# Patient Record
Sex: Male | Born: 1965 | Race: White | Hispanic: No | Marital: Married | Smoking: Former smoker
Health system: Southern US, Community
[De-identification: ages and names within clinical notes are randomized; demographics above are authoritative.]

## PROBLEM LIST (undated history)

## (undated) DIAGNOSIS — I10 Essential (primary) hypertension: Secondary | ICD-10-CM

## (undated) DIAGNOSIS — N200 Calculus of kidney: Secondary | ICD-10-CM

## (undated) DIAGNOSIS — R748 Abnormal levels of other serum enzymes: Secondary | ICD-10-CM

## (undated) DIAGNOSIS — M7022 Olecranon bursitis, left elbow: Secondary | ICD-10-CM

## (undated) DIAGNOSIS — M25431 Effusion, right wrist: Secondary | ICD-10-CM

## (undated) DIAGNOSIS — M059 Rheumatoid arthritis with rheumatoid factor, unspecified: Secondary | ICD-10-CM

## (undated) DIAGNOSIS — M722 Plantar fascial fibromatosis: Secondary | ICD-10-CM

## (undated) HISTORY — PX: LEG SURGERY: SHX1003

## (undated) HISTORY — DX: Abnormal levels of other serum enzymes: R74.8

## (undated) HISTORY — DX: Effusion, right wrist: M25.431

## (undated) HISTORY — PX: SHOULDER SURGERY: SHX246

## (undated) HISTORY — DX: Plantar fascial fibromatosis: M72.2

## (undated) HISTORY — DX: Olecranon bursitis, left elbow: M70.22

## (undated) HISTORY — DX: Rheumatoid arthritis with rheumatoid factor, unspecified: M05.9

---

## 2004-03-01 ENCOUNTER — Other Ambulatory Visit: Payer: Self-pay

## 2008-04-05 ENCOUNTER — Emergency Department: Payer: Self-pay | Admitting: Emergency Medicine

## 2009-03-22 ENCOUNTER — Emergency Department: Payer: Self-pay | Admitting: Unknown Physician Specialty

## 2017-03-15 DIAGNOSIS — Z79899 Other long term (current) drug therapy: Secondary | ICD-10-CM | POA: Insufficient documentation

## 2017-03-15 DIAGNOSIS — M7022 Olecranon bursitis, left elbow: Secondary | ICD-10-CM

## 2017-03-15 DIAGNOSIS — M722 Plantar fascial fibromatosis: Secondary | ICD-10-CM | POA: Insufficient documentation

## 2017-03-15 DIAGNOSIS — R748 Abnormal levels of other serum enzymes: Secondary | ICD-10-CM

## 2017-03-15 DIAGNOSIS — M059 Rheumatoid arthritis with rheumatoid factor, unspecified: Secondary | ICD-10-CM

## 2017-03-15 HISTORY — DX: Olecranon bursitis, left elbow: M70.22

## 2017-03-15 HISTORY — DX: Abnormal levels of other serum enzymes: R74.8

## 2017-03-15 HISTORY — DX: Plantar fascial fibromatosis: M72.2

## 2017-03-15 HISTORY — DX: Rheumatoid arthritis with rheumatoid factor, unspecified: M05.9

## 2018-05-24 DIAGNOSIS — M25431 Effusion, right wrist: Secondary | ICD-10-CM | POA: Insufficient documentation

## 2018-05-24 HISTORY — DX: Effusion, right wrist: M25.431

## 2018-10-19 ENCOUNTER — Emergency Department
Admission: EM | Admit: 2018-10-19 | Discharge: 2018-10-19 | Disposition: A | Discharge status: Home / Self Care | Payer: BLUE CROSS/BLUE SHIELD | Attending: Emergency Medicine | Admitting: Emergency Medicine

## 2018-10-19 ENCOUNTER — Emergency Department: Payer: BLUE CROSS/BLUE SHIELD

## 2018-10-19 ENCOUNTER — Other Ambulatory Visit: Payer: Self-pay

## 2018-10-19 DIAGNOSIS — F17200 Nicotine dependence, unspecified, uncomplicated: Secondary | ICD-10-CM | POA: Diagnosis not present

## 2018-10-19 DIAGNOSIS — N200 Calculus of kidney: Secondary | ICD-10-CM | POA: Diagnosis not present

## 2018-10-19 DIAGNOSIS — I10 Essential (primary) hypertension: Secondary | ICD-10-CM | POA: Insufficient documentation

## 2018-10-19 DIAGNOSIS — R1031 Right lower quadrant pain: Secondary | ICD-10-CM | POA: Diagnosis present

## 2018-10-19 HISTORY — DX: Calculus of kidney: N20.0

## 2018-10-19 HISTORY — DX: Essential (primary) hypertension: I10

## 2018-10-19 LAB — COMPREHENSIVE METABOLIC PANEL
ALK PHOS: 73 U/L (ref 38–126)
ALT: 42 U/L (ref 0–44)
AST: 30 U/L (ref 15–41)
Albumin: 4.1 g/dL (ref 3.5–5.0)
Anion gap: 9 (ref 5–15)
BUN: 20 mg/dL (ref 6–20)
CALCIUM: 9.1 mg/dL (ref 8.9–10.3)
CHLORIDE: 104 mmol/L (ref 98–111)
CO2: 24 mmol/L (ref 22–32)
CREATININE: 1.09 mg/dL (ref 0.61–1.24)
GFR calc Af Amer: >60 mL/min (ref >60)
GFR calc non Af Amer: >60 mL/min (ref >60)
Glucose, Bld: 145 mg/dL — ABNORMAL HIGH (ref 70–99)
Potassium: 4.1 mmol/L (ref 3.5–5.1)
SODIUM: 137 mmol/L (ref 135–145)
Total Bilirubin: 1 mg/dL (ref 0.3–1.2)
Total Protein: 7.5 g/dL (ref 6.5–8.1)

## 2018-10-19 LAB — URINALYSIS, COMPLETE (UACMP) WITH MICROSCOPIC
Bacteria, UA: NONE SEEN
Bilirubin Urine: NEGATIVE
GLUCOSE, UA: NEGATIVE mg/dL
Hgb urine dipstick: NEGATIVE
Ketones, ur: NEGATIVE mg/dL
Leukocytes, UA: NEGATIVE
NITRITE: NEGATIVE
PROTEIN: NEGATIVE mg/dL
SPECIFIC GRAVITY, URINE: 1.019 (ref 1.005–1.030)
SQUAMOUS EPITHELIAL / LPF: NONE SEEN (ref 0–5)
pH: 7 (ref 5.0–8.0)

## 2018-10-19 LAB — CBC
HEMATOCRIT: 42.3 % (ref 39.0–52.0)
Hemoglobin: 14.3 g/dL (ref 13.0–17.0)
MCH: 30.8 pg (ref 26.0–34.0)
MCHC: 33.8 g/dL (ref 30.0–36.0)
MCV: 91.2 fL (ref 80.0–100.0)
Platelets: 184 10*3/uL (ref 150–400)
RBC: 4.64 MIL/uL (ref 4.22–5.81)
RDW: 12.7 % (ref 11.5–15.5)
WBC: 9.1 10*3/uL (ref 4.0–10.5)
nRBC: 0 % (ref 0.0–0.2)

## 2018-10-19 MED ORDER — OXYCODONE-ACETAMINOPHEN 5-325 MG PO TABS: 1.0000 | ORAL_TABLET | Freq: Three times a day (TID) | ORAL | 0 refills | Status: DC | PRN

## 2018-10-19 MED ORDER — HYDROMORPHONE HCL 1 MG/ML IJ SOLN
0.5000 mg | Freq: Once | INTRAMUSCULAR | Status: AC
  Administered 2018-10-19: 0.5 mg via INTRAVENOUS
  Filled 2018-10-19: qty 1

## 2018-10-19 MED ORDER — ONDANSETRON 4 MG PO TBDP: 4.0000 mg | ORAL_TABLET | Freq: Three times a day (TID) | ORAL | 0 refills | Status: DC | PRN

## 2018-10-19 MED ORDER — KETOROLAC TROMETHAMINE 30 MG/ML IJ SOLN
30.0000 mg | Freq: Once | INTRAMUSCULAR | Status: AC
  Administered 2018-10-19: 30 mg via INTRAVENOUS
  Filled 2018-10-19: qty 1

## 2018-10-19 MED ORDER — ONDANSETRON HCL 4 MG/2ML IJ SOLN
4.0000 mg | Freq: Once | INTRAMUSCULAR | Status: AC
  Administered 2018-10-19: 4 mg via INTRAVENOUS
  Filled 2018-10-19: qty 2

## 2018-10-19 MED ORDER — TAMSULOSIN HCL 0.4 MG PO CAPS: 0.4000 mg | ORAL_CAPSULE | Freq: Every day | ORAL | 0 refills | Status: DC

## 2018-10-19 NOTE — ED Notes (Signed)
Patient transported to CT 

## 2018-10-19 NOTE — ED Notes (Signed)
First Nurse Note: Patient ambulatory to Rm 25, Mateo Flow RN aware of room placement.

## 2018-10-19 NOTE — ED Provider Notes (Signed)
[facility] Emergency Department Provider Note       Time seen: ----------------------------------------- 8:02 AM on 10/19/2018 -----------------------------------------   I have reviewed the triage vital signs and the nursing notes.  HISTORY   Chief Complaint Flank Pain    HPI Derrick Merritt is a 52 y.o. male with a history of hypertension and kidney stones who presents to the ED for right flank pain.  Patient arrives by private vehicle from home complaining of right flank pain and hematuria since Sunday.  He has not had fever, chest pain or shortness of breath.  He has had nausea but no vomiting, reports kidney stones in the past that felt similarly.  Past Medical History:  Diagnosis Date  . Hypertension   . Kidney stones     There are no active problems to display for this patient.   Past Surgical History:  Procedure Laterality Date  . LEG SURGERY    . SHOULDER SURGERY      Allergies Patient has no known allergies.  Social History Social History   Tobacco Use  . Smoking status: Current Every Day Smoker  . Smokeless tobacco: Never Used  Substance Use Topics  . Alcohol use: Not on file  . Drug use: Not on file   Review of Systems Constitutional: Negative for fever. Cardiovascular: Negative for chest pain. Respiratory: Negative for shortness of breath. Gastrointestinal: Positive for flank pain Genitourinary: Negative for dysuria. Musculoskeletal: Negative for back pain. Skin: Negative for rash. Neurological: Negative for headaches, focal weakness or numbness.  All systems negative/normal/unremarkable except as stated in the HPI  ____________________________________________   PHYSICAL EXAM:  VITAL SIGNS: ED Triage Vitals  Enc Vitals Group     BP 10/19/18 0632 (!) 183/84     Pulse Rate 10/19/18 0632 71     Resp 10/19/18 0632 18     Temp 10/19/18 0632 97.7 F (36.5 C)     Temp Source 10/19/18 0632 Oral     SpO2 10/19/18  0632 98 %     Weight 10/19/18 0629 278 lb (126.1 kg)     Height 10/19/18 0629 6\' 1"  (1.854 m)     Head Circumference --      Peak Flow --      Pain Score 10/19/18 0629 6     Pain Loc --      Pain Edu? --      Excl. in [place]? --    Constitutional: Alert and oriented. Well appearing and in no distress. Eyes: Conjunctivae are normal. Normal extraocular movements. Cardiovascular: Normal rate, regular rhythm. No murmurs, rubs, or gallops. Respiratory: Normal respiratory effort without tachypnea nor retractions. Breath sounds are clear and equal bilaterally. No wheezes/rales/rhonchi. Gastrointestinal: Right flank tenderness, no rebound or guarding.  Normal bowel sounds. Musculoskeletal: Nontender with normal range of motion in extremities. No lower extremity tenderness nor edema. Neurologic:  Normal speech and language. No gross focal neurologic deficits are appreciated.  Skin:  Skin is warm, dry and intact. No rash noted. Psychiatric: Mood and affect are normal. Speech and behavior are normal.  ____________________________________________  ED COURSE:  As part of my medical decision making, I reviewed the following data within the [facility] History obtained from family if available, nursing notes, old chart and ekg, as well as notes from prior ED visits. Patient presented for right flank pain, we will assess with labs and imaging as indicated at this time.   Procedures ____________________________________________   LABS (pertinent positives/negatives)  Labs  Reviewed  URINALYSIS, COMPLETE (UACMP) WITH MICROSCOPIC - Abnormal; Notable for the following components:      Result Value   Color, Urine YELLOW (*)    APPearance CLEAR (*)    All other components within normal limits  COMPREHENSIVE METABOLIC PANEL - Abnormal; Notable for the following components:   Glucose, Bld 145 (*)    All other components within normal limits  CBC    RADIOLOGY Images were viewed by  me  Abdomen 2 view IMPRESSION: Negative. CT renal protocol IMPRESSION: 1. Obstructing calculus at the RIGHT vesicoureteral junction with moderate hydronephrosis and hydroureter on the RIGHT. 2. No nephrolithiasis.  ____________________________________________  DIFFERENTIAL DIAGNOSIS   Renal colic, UTI, pyelonephritis, gas pain, muscle strain, radicular back pain  FINAL ASSESSMENT AND PLAN  Renal colic   Plan: The patient had presented for right-sided flank pain. Patient's labs were unremarkable. Patient's imaging initially was negative, and CT there did appear to be a right UVJ stone.  He will be discharged with Flomax, pain medicine and antiemetics and referred to urology for outpatient follow-up.   Laurence Aly, MD   Note: This note was generated in part or whole with voice recognition software. Voice recognition is usually quite accurate but there are transcription errors that can and very often do occur. I apologize for any typographical errors that were not detected and corrected.     Earleen Newport, MD 10/19/18 [phone]

## 2018-10-19 NOTE — ED Triage Notes (Signed)
Pt arrives to ED via POV from home with c/o right flank pain and hematuria since Sunday. No fever, CP or SHOB. Pt reports (+) nausea, but denies vomiting. Pt reports h/x of kidney stones in past and today's s/x's are similar. Ptis A&O, in NAD; RR even, regular, and unlabored.

## 2018-10-19 NOTE — ED Notes (Addendum)
Patient transported to x-ray. ?

## 2018-11-06 ENCOUNTER — Encounter: Payer: Self-pay | Admitting: Urology

## 2018-11-06 ENCOUNTER — Ambulatory Visit (INDEPENDENT_AMBULATORY_CARE_PROVIDER_SITE_OTHER): Payer: BLUE CROSS/BLUE SHIELD | Admitting: Urology

## 2018-11-06 VITALS — BP 149/86 | HR 82 | Ht 72.0 in | Wt 275.0 lb

## 2018-11-06 DIAGNOSIS — N200 Calculus of kidney: Secondary | ICD-10-CM

## 2018-11-06 NOTE — Progress Notes (Signed)
   11/06/2018 3:16 PM   Derrick Merritt 06-01-1966 [id number]  CC: 5 mm right UPJ stone  HPI: I saw Derrick Merritt in urology clinic today for discussion of nephrolithiasis.  He is a healthy 53 year old male with a history of 4-5 spontaneously passed kidney stones who was seen in the emergency department on 10/19/2018 with right-sided back pain and gross hematuria.  CT scan showed a 5 mm right UPJ stone with severe upstream hydronephrosis.  He was discharged with medical expulsive therapy.  He states that a week later he had had a complete resolution of his pain, although he did not visualize a stone passing.  He denies any complaints today including hematuria, urgency, frequency, dysuria, or flank pain.  No aggravating or alleviating factors.  Severity is mild.   PMH: Past Medical History:  Diagnosis Date  . Elevated liver enzymes 03/15/2017  . Hypertension   . Kidney stones   . Olecranon bursitis of left elbow 03/15/2017  . 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 Swelling of joint, wrist, right 05/24/2018   SYNOVIAL THICKENING RIGHT DORSUM WRIST    Surgical History: Past Surgical History:  Procedure Laterality Date  . LEG SURGERY    . SHOULDER SURGERY      Allergies:  Allergies  Allergen Reactions  . Pollen Extract Rash    Family History: No family history on file.  Social History:  reports that he has been smoking. He has never used smokeless tobacco. No history on file for alcohol and drug.  ROS: Please see flowsheet from today's date for complete review of systems.  Physical Exam: BP (!) 149/86   Pulse 82   Ht 6' (1.829 m)   Wt 275 lb (124.7 kg)   BMI 37.30 kg/m    Constitutional:  Alert and oriented, No acute distress. Cardiovascular: No clubbing, cyanosis, or edema. Respiratory: Normal respiratory effort, no increased work of breathing. GI: Abdomen  is soft, nontender, nondistended, no abdominal masses GU: No CVA tenderness Lymph: No cervical or inguinal lymphadenopathy. Skin: No rashes, bruises or suspicious lesions. Neurologic: Grossly intact, no focal deficits, moving all 4 extremities. Psychiatric: Normal mood and affect.  Laboratory Data: Reviewed  Pertinent Imaging: I have personally reviewed the CT stone protocol dated 10/19/2018.  There is a right 5 mm UPJ stone with severe right hydronephrosis.  Assessment & Plan:   In summary, Derrick Merritt is a healthy 53 year old male with multiple spontaneously passed kidney stones, including likely recent spontaneous passage of a 5 mm right UPJ stone.  His symptoms have completely resolved, though he has not seen or caught a stone with intermittent straining of his urine.  We discussed general stone prevention strategies including adequate hydration with goal of producing 2.5 L of urine daily, increasing citric acid intake, increasing calcium intake during high oxalate meals, minimizing animal protein, and decreasing salt intake. Information about dietary recommendations given today.   Renal ultrasound to rule out persistent ureteral stone/hydronephrosis, will call with results Follow-up as needed  Billey Co, [place] [address] [place], [place] [address] [phone]

## 2018-11-15 ENCOUNTER — Ambulatory Visit
Admission: RE | Admit: 2018-11-15 | Discharge: 2018-11-15 | Disposition: A | Discharge status: Home / Self Care | Payer: BLUE CROSS/BLUE SHIELD | Source: Ambulatory Visit | Attending: Urology | Admitting: Urology

## 2018-11-15 DIAGNOSIS — N200 Calculus of kidney: Secondary | ICD-10-CM | POA: Insufficient documentation

## 2018-11-16 ENCOUNTER — Telehealth: Payer: Self-pay

## 2018-11-16 NOTE — Telephone Encounter (Signed)
-----   Message from Billey Co, MD sent at 11/16/2018  8:15 AM EST ----- His renal ultrasound does not show any swelling in his kidneys, and looks like the stone has passed. Can follow up as needed  Nickolas Madrid, MD 11/16/2018

## 2018-11-16 NOTE — Telephone Encounter (Signed)
Informed patient of results and recommendations. 

## 2019-02-27 ENCOUNTER — Ambulatory Visit (INDEPENDENT_AMBULATORY_CARE_PROVIDER_SITE_OTHER): Payer: BLUE CROSS/BLUE SHIELD | Admitting: Gastroenterology

## 2019-02-27 ENCOUNTER — Other Ambulatory Visit: Payer: Self-pay

## 2019-02-27 VITALS — Ht 72.0 in | Wt 270.0 lb

## 2019-02-27 DIAGNOSIS — K921 Melena: Secondary | ICD-10-CM | POA: Diagnosis not present

## 2019-02-27 DIAGNOSIS — R1011 Right upper quadrant pain: Secondary | ICD-10-CM

## 2019-02-27 MED ORDER — NA SULFATE-K SULFATE-MG SULF 17.5-3.13-1.6 GM/177ML PO SOLN: 1.0000 | Freq: Once | ORAL | 0 refills | Status: AC

## 2019-02-27 NOTE — Patient Instructions (Addendum)
I am recommending a trial of pantoprazole 40 daily x 8 weeks.  Avoid all anti-inflammatory medications (such as NSAIDs)  I am recommending an EGD and Colonoscopy when the covid19 restrictions will allow. We will can to schedule the procedures when able.  Please call in the meantime with any additional questions or concerns in the meantime.   Thank you for your patience with me and our technology today! Please stay home, safe, and healthy. I look forward to meeting you in person in the future.

## 2019-02-27 NOTE — Progress Notes (Signed)
TELEHEALTH VISIT  Referring Provider: Leonard Downing, * Primary Care Physician:  Leonard Downing, MD   Tele-visit due to COVID-19 pandemic Patient requested visit virtually, consented to the virtual encounter via video enabled telemedicine application (Doximity difficulty with the download, converted to telephone encounter) Contact made at: 15:34 02/27/19 Patient verified by name and date of birth Location of patient: Home Location provider: Office Names of persons participating: Me, patient, Magdalene River CMA Time spent on telehealth visit: 26 minutes I discussed the limitations of evaluation and management by telemedicine. The patient expressed understanding and agreed to proceed.  Reason for Consultation:  Positive hemosure   IMPRESSION:  Positive hemossure RUQ pain Daily prednisone for joint pain No prior colon cancer screening No known family history of colon cancer or polyps  Colonoscopy recommended given the positive hemosure and no prior colon cancer screening.   Differential for RUQ is broad. I have recommended empiric treatment for possible esophagitis, gastritis, or prednisone-related peptic ulcer disease. Will plan EGD at the time of colonoscopy for further evaluation.   PLAN: Trial of pantoprazole 40 daily x 8 weeks Avoid all NSAIDs EGD and Colonoscopy   I consented the patient discussing the risks, benefits, and alternatives to endoscopic evaluation. In particular, we discussed the risks that include, but are not limited to, reaction to medication, cardiopulmonary compromise, bleeding requiring blood transfusion, aspiration resulting in pneumonia, perforation requiring surgery, lack of diagnosis, severe illness requiring hospitalization, and even death. We reviewed the risk of missed lesion including polyps or even cancer. The patient acknowledges these risks and asks that we proceed.   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 Arelia Sneddon for further evaluation of hemosure. The history is obtained through the patient and review of his electronic health record.   Hemoccult positive during routine exam with primary care provider. Intermittent dark spots in the stool. No frank red blood.  No known associated anemia.   For the last year he has been concerned that he may have a hernia. Intermittent abdominal pain with ache if he overdoes located at the bottom of his right rib cage. Over the last 2 weeks, he feels a pressure in the area where he thought a hernia would be. No nausea or vomiting. No odynophagia or dysphagia. No other abdominal pain. Pain is not severe enough to justify medication.   Constipation associated with Mucinex use in December. Resolved with a stool softener. No other change in bowel habits. He has one to two formed to hard bowel movements daily. No other associated symptoms. No identified exacerbating or relieving features.   On prednisone for arthritis. Denies the use of any NSAIDs including Naproxen.   No prior endoscopic evaluation.   No known family history of colon cancer or polyps. No family history of uterine/endometrial cancer, pancreatic cancer or gastric/stomach cancer.  Review of EPIC shows a CT renal protocol 10/19/18 that showed no abdominal abnormalities. He did have an obstructing calculus at the right vesicoureteral junction with moderate hydropnephrosis and hydroureter on the right.   Labs from 10/19/18 show a normal CMP except for glucose of 145 and normal CBC with hgb 14.3, MCV 91.2, RDW 12.7.   Past Medical History:  Diagnosis Date  . Elevated liver enzymes 03/15/2017  . Hypertension   . Kidney stones   . Olecranon bursitis of left elbow 03/15/2017  . 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 Swelling of joint, wrist, right 05/24/2018   SYNOVIAL THICKENING RIGHT  DORSUM WRIST    Past Surgical History:  Procedure Laterality Date  . LEG SURGERY    . SHOULDER SURGERY      Current Outpatient Medications  Medication Sig Dispense Refill  . Abatacept (ORENCIA CLICKJECT) 941 MG/ML SOAJ Inject 125 mg into the skin once a week.    . calcium carbonate (OSCAL) 1500 (600 Ca) MG TABS tablet Take 1,500 mg by mouth daily with breakfast.    . naproxen sodium (ALEVE) 220 MG tablet Take 1 tablet by mouth every 6 (six) hours as needed.    . predniSONE (DELTASONE) 1 MG tablet Take 4 tablets by mouth daily. Take daily with breakfast    . terazosin (HYTRIN) 10 MG capsule Take 10 mg by mouth at bedtime. Take two capsules by mouth daily at bedtime     No current facility-administered medications for this visit.     Allergies as of 02/27/2019 - Review Complete 02/27/2019  Allergen Reaction Noted  . Pollen extract Rash 03/15/2017    Family History  Problem Relation Age of Onset  . Diabetes Mother   . Hypertension Mother   . Heart disease Mother   . Diabetes Father   . Hypertension Father   . Colon cancer Neg Hx   . Stomach cancer Neg Hx   . Pancreatic cancer Neg Hx   . Esophageal cancer Neg Hx     Social History   Socioeconomic History  . Marital status: Married    Spouse name: Not on file  . Number of children: Not on file  . Years of education: Not on file  . Highest education level: Not on file  Occupational History  . Not on file  Social Needs  . Financial resource strain: Not on file  . Food insecurity:    Worry: Not on file    Inability: Not on file  . Transportation needs:    Medical: Not on file    Non-medical: Not on file  Tobacco Use  . Smoking status: Current Every Day Smoker  . Smokeless tobacco: Never Used  . Tobacco comment: 1 ppd   Substance and Sexual Activity  . Alcohol use: Yes    Comment: occ  . Drug use: Yes  . Sexual activity: Yes    Partners: Female  Lifestyle  . Physical activity:    Days per week: Not on file     Minutes per session: Not on file  . Stress: Not on file  Relationships  . Social connections:    Talks on phone: Not on file    Gets together: Not on file    Attends religious service: Not on file    Active member of club or organization: Not on file    Attends meetings of clubs or organizations: Not on file    Relationship status: Not on file  . Intimate partner violence:    Fear of current or ex partner: Not on file    Emotionally abused: Not on file    Physically abused: Not on file    Forced sexual activity: Not on file  Other Topics Concern  . Not on file  Social History Narrative  . Not on file    Review of Systems: ALL ROS discussed and all others negative except listed in HPI.  Physical Exam: General: in no acute distress Neuro: Alert and appropriate Psych: Normal affect and  normal insight   Melyna Huron L. Tarri Glenn, MD, MPH [facility] 02/27/2019, 3:33 PM

## 2019-03-21 ENCOUNTER — Telehealth: Payer: Self-pay | Admitting: *Deleted

## 2019-03-21 NOTE — Telephone Encounter (Signed)
Noted. Thank you. May proceed with endoscopy.

## 2019-03-21 NOTE — Telephone Encounter (Signed)
Covid-19 travel screening questions  Have you traveled in the last 14 days?yes If yes where? [place]  Do you now or have you had a fever in the last 14 days? No  Do you have any respiratory symptoms of shortness of breath or cough now or in the last 14 days?no  Do you have a medical history of Congestive Heart Failure?  Do you have a medical history of lung disease?  Do you have any family members or close contacts with diagnosed or suspected Covid-19?no  Pt has been made aware of the care partner policy and will bring mask with him if he has one available. SM

## 2019-03-22 ENCOUNTER — Encounter: Payer: Self-pay | Admitting: Gastroenterology

## 2019-03-22 ENCOUNTER — Ambulatory Visit (AMBULATORY_SURGERY_CENTER): Payer: BLUE CROSS/BLUE SHIELD | Admitting: Gastroenterology

## 2019-03-22 ENCOUNTER — Other Ambulatory Visit: Payer: Self-pay

## 2019-03-22 VITALS — BP 137/87 | HR 54 | Temp 98.4°F | Resp 14 | Ht 72.0 in | Wt 270.0 lb

## 2019-03-22 DIAGNOSIS — K2970 Gastritis, unspecified, without bleeding: Secondary | ICD-10-CM

## 2019-03-22 DIAGNOSIS — D124 Benign neoplasm of descending colon: Secondary | ICD-10-CM

## 2019-03-22 DIAGNOSIS — K921 Melena: Secondary | ICD-10-CM

## 2019-03-22 DIAGNOSIS — D122 Benign neoplasm of ascending colon: Secondary | ICD-10-CM

## 2019-03-22 DIAGNOSIS — R1011 Right upper quadrant pain: Secondary | ICD-10-CM

## 2019-03-22 MED ORDER — SODIUM CHLORIDE 0.9 % IV SOLN: 500.0000 mL | Freq: Once | INTRAVENOUS | Status: DC

## 2019-03-22 NOTE — Op Note (Signed)
[facility] Patient Name: Derrick Merritt Procedure Date: 03/22/2019 2:52 PM MRN: [id number] Endoscopist: Thornton Park MD, MD Age: 53 Referring MD:  Date of Birth: Apr 01, 1966 Gender: Male Account #: [id number] Procedure:                Colonoscopy Indications:              Heme positive stool                           RUQ pain                           Daily prednisone for joint pain                           No prior colon cancer screening                           No known family history of colon cancer or                            polypsPositive hemossure                           RUQ pain Medicines:                See the Anesthesia note for documentation of the                            administered medications Procedure:                Pre-Anesthesia Assessment:                           - Prior to the procedure, a History and Physical                            was performed, and patient medications and                            allergies were reviewed. The patient's tolerance of                            previous anesthesia was also reviewed. The risks                            and benefits of the procedure and the sedation                            options and risks were discussed with the patient.                            All questions were answered, and informed consent                            was obtained. Prior Anticoagulants: The patient has  taken no previous anticoagulant or antiplatelet                            agents. ASA Grade Assessment: II - A patient with                            mild systemic disease. After reviewing the risks                            and benefits, the patient was deemed in                            satisfactory condition to undergo the procedure.                           - Prior to the procedure, a History and Physical                            was performed, and patient medications and                             allergies were reviewed. The patient's tolerance of                            previous anesthesia was also reviewed. The risks                            and benefits of the procedure and the sedation                            options and risks were discussed with the patient.                            All questions were answered, and informed consent                            was obtained. Prior Anticoagulants: The patient has                            taken no previous anticoagulant or antiplatelet                            agents. ASA Grade Assessment: II - A patient with                            mild systemic disease. After reviewing the risks                            and benefits, the patient was deemed in                            satisfactory condition to undergo the procedure.  After obtaining informed consent, the colonoscope                            was passed under direct vision. Throughout the                            procedure, the patient's blood pressure, pulse, and                            oxygen saturations were monitored continuously. The                            Colonoscope was introduced through the anus and                            advanced to the the terminal ileum, with                            identification of the appendiceal orifice and IC                            valve. The colonoscopy was performed without                            difficulty. The patient tolerated the procedure                            well. The quality of the bowel preparation was                            good. The terminal ileum, ileocecal valve,                            appendiceal orifice, and rectum were photographed. Scope In: 3:10:01 PM Scope Out: 3:25:56 PM Scope Withdrawal Time: 0 hours 12 minutes 38 seconds  Total Procedure Duration: 0 hours 15 minutes 55 seconds  Findings:                 A 4 mm  polyp was found in the ascending colon. The                            polyp was sessile. The polyp was removed with a                            cold snare. Resection and retrieval were complete.                            Estimated blood loss was minimal.                           Three sessile polyps were found in the descending                            colon. The polyps were  3 mm in size. These polyps                            were removed with a cold snare. Resection and                            retrieval were complete. Estimated blood loss was                            minimal.                           The exam was otherwise without abnormality on                            direct and retroflexion views.                           External hemorrhoids were found on perianal exam. Complications:            No immediate complications. Estimated blood loss:                            Minimal. Impression:               - One 4 mm polyp in the ascending colon, removed                            with a cold snare. Resected and retrieved.                           - Three 3 mm polyps in the descending colon,                            removed with a cold snare. Resected and retrieved.                           - External hemorrhoids.                           - The examination was otherwise normal on direct                            and retroflexion views. Recommendation:           - Patient has a contact number available for                            emergencies. The signs and symptoms of potential                            delayed complications were discussed with the                            patient. Return to normal activities tomorrow.  Written discharge instructions were provided to the                            patient.                           - Resume regular diet.                           - Continue present medications.                            - Await pathology results.                           - Repeat colonoscopy date to be determined after                            pending pathology results are reviewed for                            surveillance based on pathology results. Thornton Park MD, MD 03/22/2019 3:39:49 PM This report has been signed electronically.

## 2019-03-22 NOTE — Progress Notes (Signed)
Pt's states no medical or surgical changes since previsit or office visit. 

## 2019-03-22 NOTE — Progress Notes (Signed)
To PACU, VSS. Report to Rn.tb 

## 2019-03-22 NOTE — Patient Instructions (Signed)
Handouts given for polyps and hemorrhoids.  Await biopsy results.  YOU HAD AN ENDOSCOPIC PROCEDURE TODAY AT [facility]:   Refer to the procedure report that was given to you for any specific questions about what was found during the examination.  If the procedure report does not answer your questions, please call your gastroenterologist to clarify.  If you requested that your care partner not be given the details of your procedure findings, then the procedure report has been included in a sealed envelope for you to review at your convenience later.  YOU SHOULD EXPECT: Some feelings of bloating in the abdomen. Passage of more gas than usual.  Walking can help get rid of the air that was put into your GI tract during the procedure and reduce the bloating. If you had a lower endoscopy (such as a colonoscopy or flexible sigmoidoscopy) you may notice spotting of blood in your stool or on the toilet paper. If you underwent a bowel prep for your procedure, you may not have a normal bowel movement for a few days.  Please Note:  You might notice some irritation and congestion in your nose or some drainage.  This is from the oxygen used during your procedure.  There is no need for concern and it should clear up in a day or so.  SYMPTOMS TO REPORT IMMEDIATELY:   Following lower endoscopy (colonoscopy or flexible sigmoidoscopy):  Excessive amounts of blood in the stool  Significant tenderness or worsening of abdominal pains  Swelling of the abdomen that is new, acute  Fever of 100F or higher   Following upper endoscopy (EGD)  Vomiting of blood or coffee ground material  New chest pain or pain under the shoulder blades  Painful or persistently difficult swallowing  New shortness of breath  Fever of 100F or higher  Black, tarry-looking stools  For urgent or emergent issues, a gastroenterologist can be reached at any hour by calling [phone].   DIET:  We do recommend a small  meal at first, but then you may proceed to your regular diet.  Drink plenty of fluids but you should avoid alcoholic beverages for 24 hours.  ACTIVITY:  You should plan to take it easy for the rest of today and you should NOT DRIVE or use heavy machinery until tomorrow (because of the sedation medicines used during the test).    FOLLOW UP: Our staff will call the number listed on your records 48-72 hours following your procedure to check on you and address any questions or concerns that you may have regarding the information given to you following your procedure. If we do not reach you, we will leave a message.  We will attempt to reach you two times.  During this call, we will ask if you have developed any symptoms of COVID 19. If you develop any symptoms (ie: fever, flu-like symptoms, shortness of breath, cough etc.) before then, please call [phone].  If you test positive for Covid 19 in the 2 weeks post procedure, please call and report this information to [place].    If any biopsies were taken you will be contacted by phone or by letter within the next 1-3 weeks.  Please call us at [phone] if you have not heard about the biopsies in 3 weeks.    SIGNATURES/CONFIDENTIALITY: You and/or your care partner have signed paperwork which will be entered into your electronic medical record.  These signatures attest to the fact that that the information  above on your After Visit Summary has been reviewed and is understood.  Full responsibility of the confidentiality of this discharge information lies with you and/or your care-partner. 

## 2019-03-22 NOTE — Op Note (Signed)
[facility] Patient Name: Derrick Merritt Procedure Date: 03/22/2019 2:51 PM MRN: [id number] Endoscopist: Thornton Park MD, MD Age: 53 Referring MD:  Date of Birth: 02-Nov-1965 Gender: Male Account #: [id number] Procedure:                Upper GI endoscopy Indications:              Abdominal pain in the right upper quadrant Medicines:                See the Anesthesia note for documentation of the                            administered medications Procedure:                Pre-Anesthesia Assessment:                           - Prior to the procedure, a History and Physical                            was performed, and patient medications and                            allergies were reviewed. The patient's tolerance of                            previous anesthesia was also reviewed. The risks                            and benefits of the procedure and the sedation                            options and risks were discussed with the patient.                            All questions were answered, and informed consent                            was obtained. Prior Anticoagulants: The patient has                            taken no previous anticoagulant or antiplatelet                            agents. ASA Grade Assessment: II - A patient with                            mild systemic disease. After reviewing the risks                            and benefits, the patient was deemed in                            satisfactory condition to undergo the procedure.  After obtaining informed consent, the endoscope was                            passed under direct vision. Throughout the                            procedure, the patient's blood pressure, pulse, and                            oxygen saturations were monitored continuously. The                            Endoscope was introduced through the mouth, and                            advanced to the  second part of duodenum. The upper                            GI endoscopy was accomplished without difficulty.                            The patient tolerated the procedure well. Scope In: Scope Out: Findings:                 The examined esophagus was normal.                           The entire examined stomach was normal. Biopsies                            were taken with a cold forceps for Helicobacter                            pylori testing. Estimated blood loss was minimal.                           The examined duodenum was normal.                           The exam was otherwise without abnormality. Complications:            No immediate complications. Estimated blood loss:                            Minimal. Impression:               - Normal esophagus.                           - Normal stomach. Biopsied.                           - Normal examined duodenum.                           - The examination was otherwise normal. Recommendation:           -  Patient has a contact number available for                            emergencies. The signs and symptoms of potential                            delayed complications were discussed with the                            patient. Return to normal activities tomorrow.                            Written discharge instructions were provided to the                            patient.                           - Resume regular diet.                           - Continue present medications.                           - Await pathology results.                           - Repeat upper endoscopy is not recommended at this                            time.                           - Proceed with colonoscopy as previously planned. Thornton Park MD, MD 03/22/2019 3:34:22 PM This report has been signed electronically.

## 2019-03-22 NOTE — Progress Notes (Signed)
Called to room to assist during endoscopic procedure.  Patient ID and intended procedure confirmed with present staff. Received instructions for my participation in the procedure from the performing physician.  

## 2019-03-26 ENCOUNTER — Telehealth: Payer: Self-pay | Admitting: *Deleted

## 2019-03-26 ENCOUNTER — Encounter: Payer: Self-pay | Admitting: Gastroenterology

## 2019-03-26 NOTE — Telephone Encounter (Signed)
  Follow up Call-  Call back number 03/22/2019  Post procedure Call Back phone  # [phone]  Permission to leave phone message Yes  Some recent data might be hidden     Patient questions:  Do you have a fever, pain , or abdominal swelling? No. Pain Score  0 *  Have you tolerated food without any problems? Yes.    Have you been able to return to your normal activities? Yes.    Do you have any questions about your discharge instructions: Diet   No. Medications  No. Follow up visit  No.  Do you have questions or concerns about your Care? No.  Actions: * If pain score is 4 or above: No action needed, pain <4.  1. Have you developed a fever since your procedure? NO  2.   Have you had an respiratory symptoms (SOB or cough) since your procedure? NO  3.   Have you tested positive for COVID 19 since your procedure NO  4.   Have you had any family members/close contacts diagnosed with the COVID 19 since your procedure?  NO   If yes to any of these questions please route to Joylene Achilles, RN and Alphonsa Gin, RN.

## 2019-07-04 IMAGING — US US RENAL
1 series · 14 of 25 positions shown · non-contrast
Comparison: None.

CLINICAL DATA: Nephrolithiasis.  Prior right hydronephrosis.

EXAM:
RENAL / URINARY TRACT ULTRASOUND COMPLETE

[Series 1: us renal · 0.31mm/px · 14 of 33 slices shown]
[im 1/33]
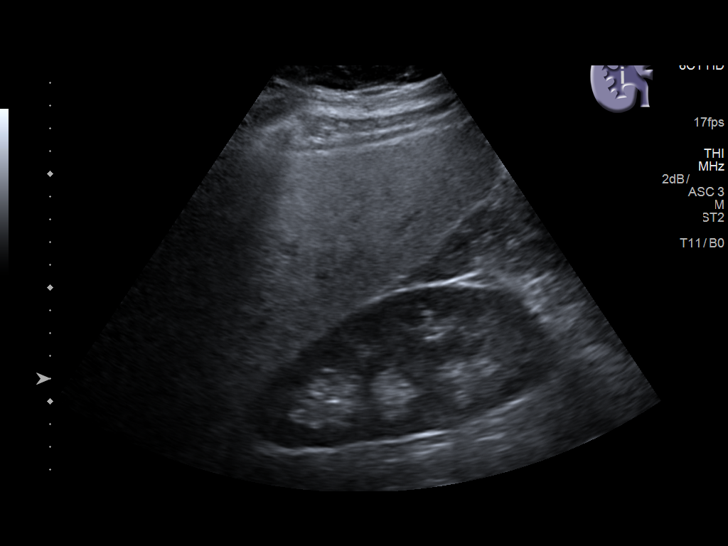
[im 3/33]
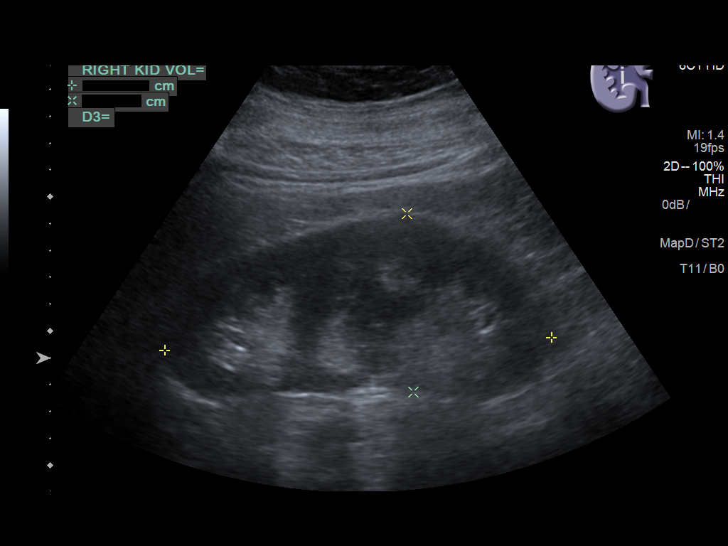
[im 6/33]
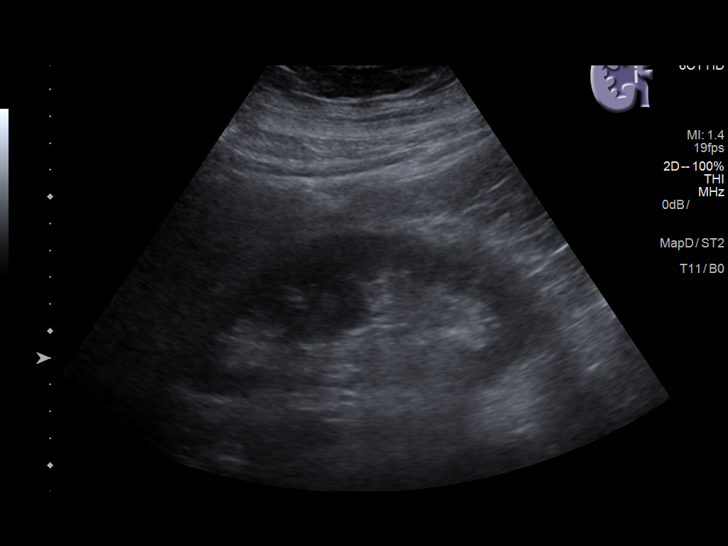
[im 9/33]
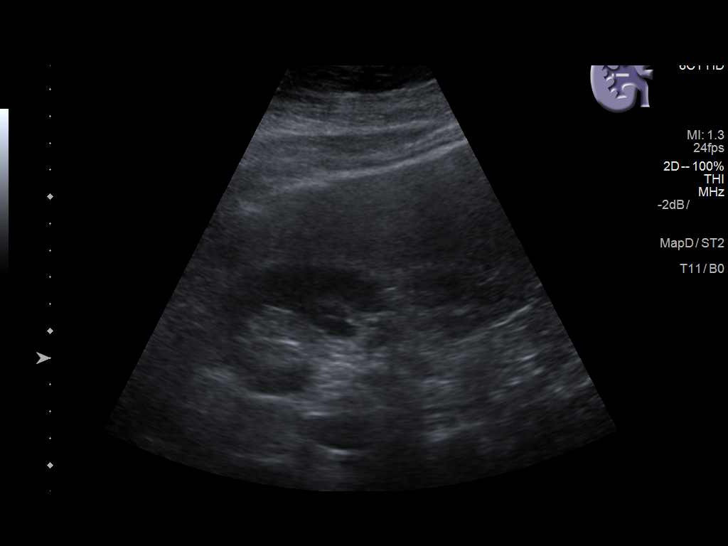
[im 11/33]
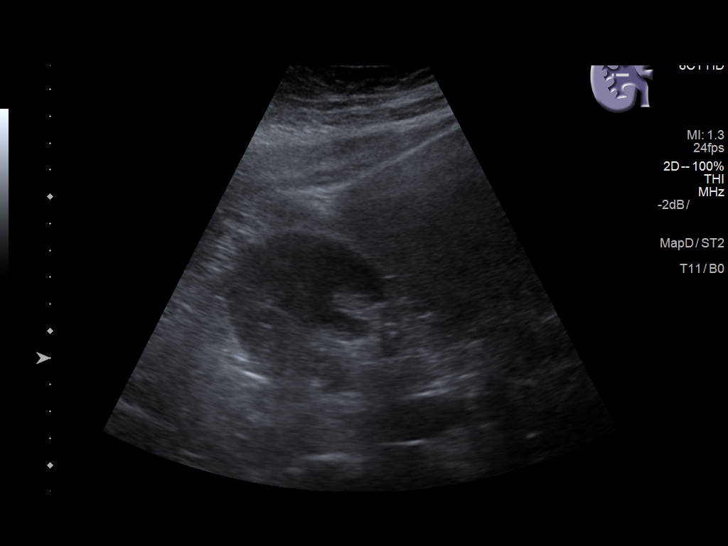
[im 13/33]
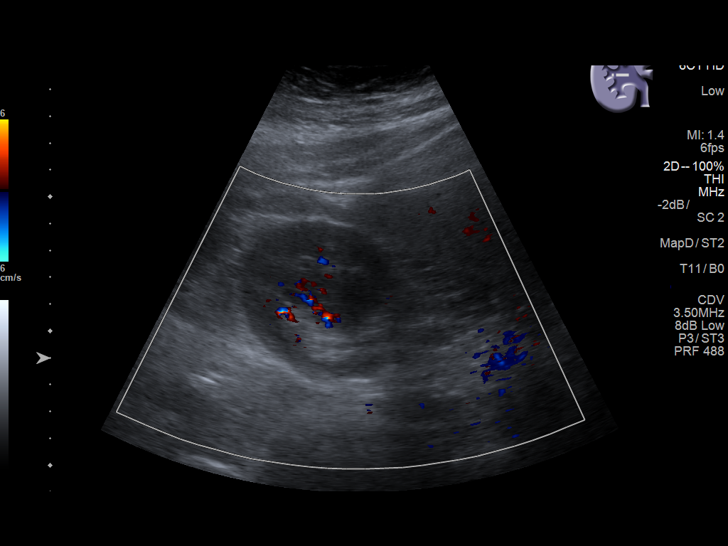
[im 15/33]
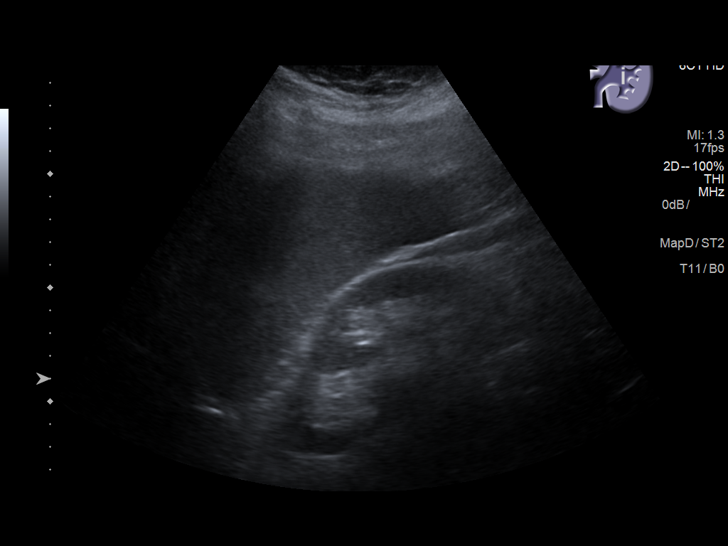
[im 18/33]
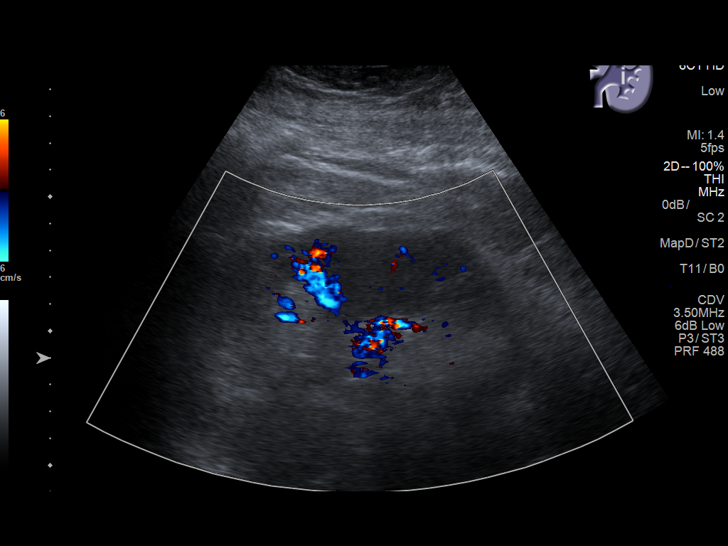
[im 21/33]
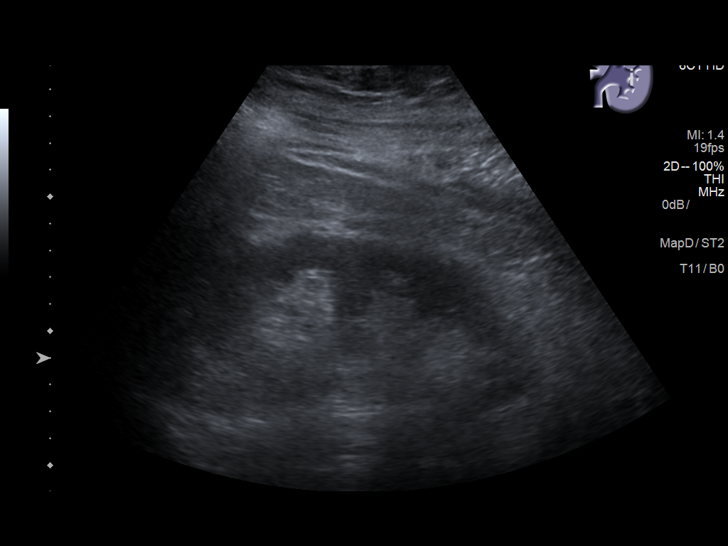
[im 22/33]
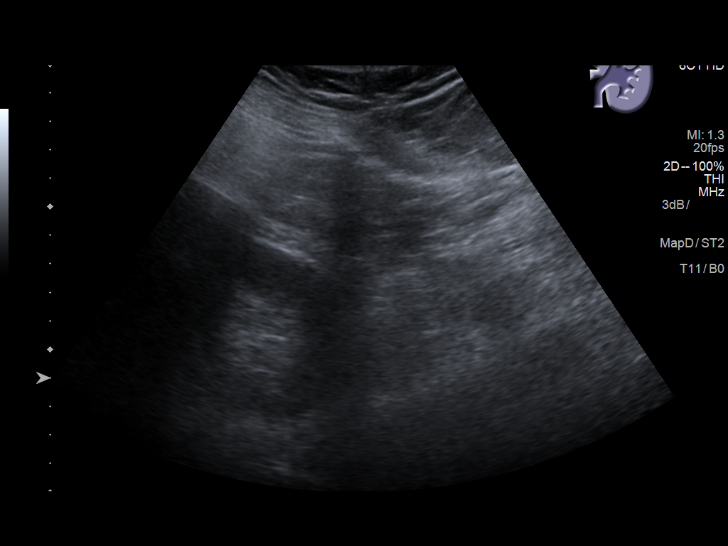
[im 25/33]
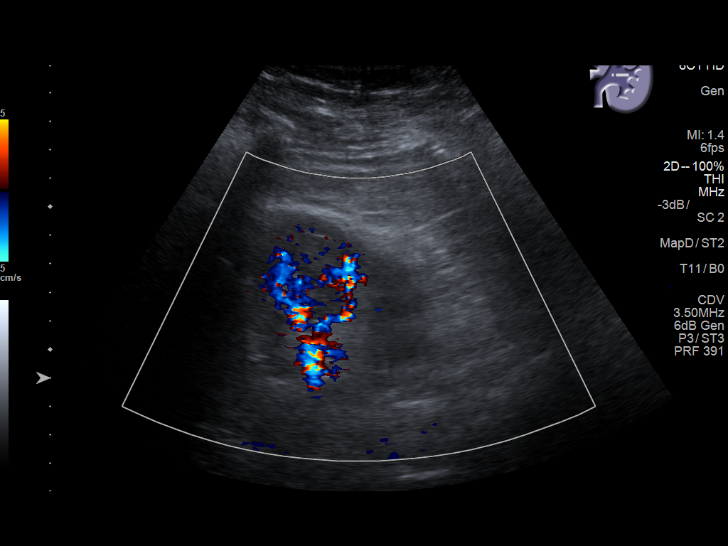
[im 27/33]
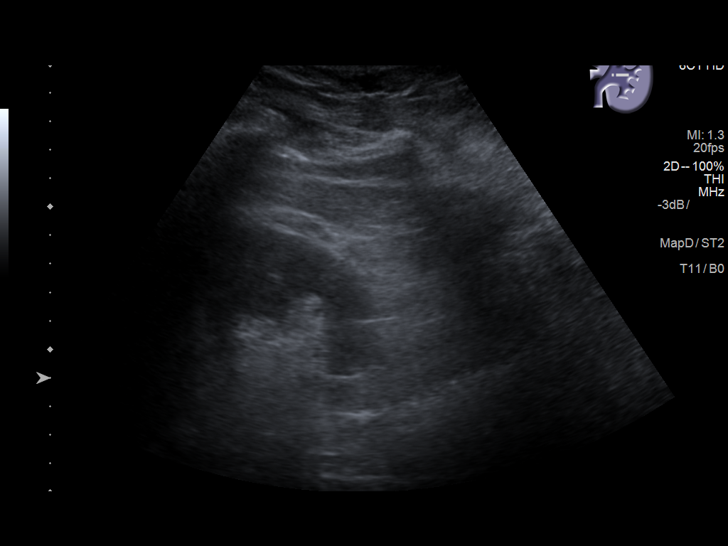
[im 30/33]
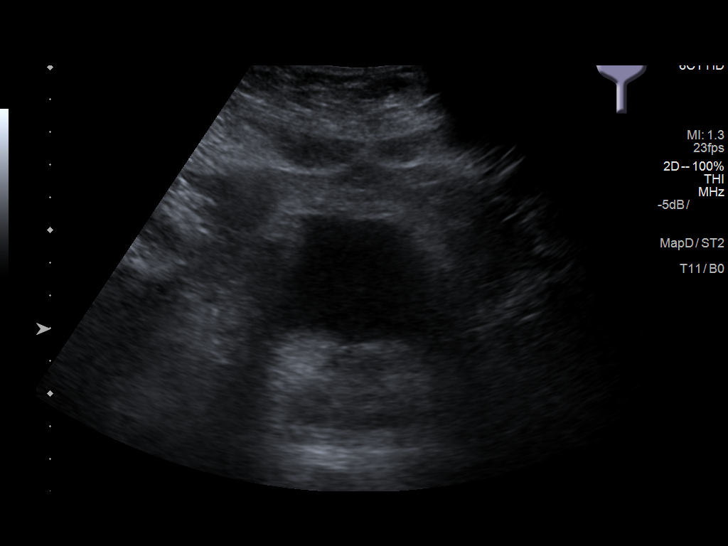
[im 33/33]
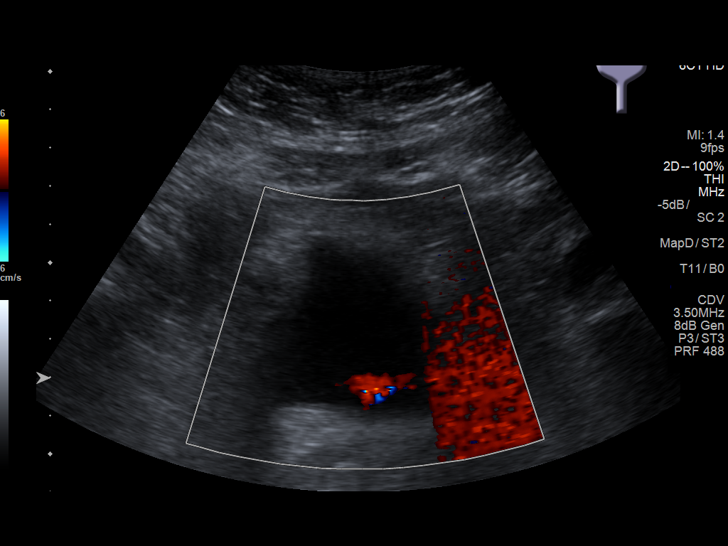

[14 of 25 positions shown; findings below may reference images not displayed]

FINDINGS: Right Kidney:

Renal measurements: 14.4 x 6.7 x 5.9 cm = volume: 296.2 mL .
Echogenicity within normal limits. No mass or hydronephrosis
visualized.

Left Kidney:

Renal measurements: 12.3 x 6.5 x 5.5 cm = volume: 231.6 mL.
Echogenicity within normal limits. No mass or hydronephrosis
visualized.

Bladder:

Appears normal for degree of bladder distention.
IMPRESSION: 1. No hydronephrosis.  No acute abnormalities.

## 2020-07-12 ENCOUNTER — Other Ambulatory Visit: Payer: Self-pay

## 2020-07-12 ENCOUNTER — Emergency Department
Admission: EM | Admit: 2020-07-12 | Discharge: 2020-07-12 | Disposition: A | Discharge status: Home / Self Care | Payer: BC Managed Care – PPO | Attending: Emergency Medicine | Admitting: Emergency Medicine

## 2020-07-12 DIAGNOSIS — K5900 Constipation, unspecified: Secondary | ICD-10-CM | POA: Insufficient documentation

## 2020-07-12 DIAGNOSIS — I10 Essential (primary) hypertension: Secondary | ICD-10-CM | POA: Insufficient documentation

## 2020-07-12 DIAGNOSIS — R1084 Generalized abdominal pain: Secondary | ICD-10-CM | POA: Insufficient documentation

## 2020-07-12 DIAGNOSIS — Z79899 Other long term (current) drug therapy: Secondary | ICD-10-CM | POA: Insufficient documentation

## 2020-07-12 DIAGNOSIS — F17210 Nicotine dependence, cigarettes, uncomplicated: Secondary | ICD-10-CM | POA: Diagnosis not present

## 2020-07-12 LAB — CBC
HCT: 42.5 % (ref 39.0–52.0)
Hemoglobin: 14.7 g/dL (ref 13.0–17.0)
MCH: 31.5 pg (ref 26.0–34.0)
MCHC: 34.6 g/dL (ref 30.0–36.0)
MCV: 91 fL (ref 80.0–100.0)
Platelets: 152 10*3/uL (ref 150–400)
RBC: 4.67 MIL/uL (ref 4.22–5.81)
RDW: 12.9 % (ref 11.5–15.5)
WBC: 6 10*3/uL (ref 4.0–10.5)
nRBC: 0 % (ref 0.0–0.2)

## 2020-07-12 LAB — COMPREHENSIVE METABOLIC PANEL
ALT: 44 U/L (ref 0–44)
AST: 35 U/L (ref 15–41)
Albumin: 4.2 g/dL (ref 3.5–5.0)
Alkaline Phosphatase: 79 U/L (ref 38–126)
Anion gap: 9 (ref 5–15)
BUN: 14 mg/dL (ref 6–20)
CO2: 27 mmol/L (ref 22–32)
Calcium: 9.1 mg/dL (ref 8.9–10.3)
Chloride: 103 mmol/L (ref 98–111)
Creatinine, Ser: 0.71 mg/dL (ref 0.61–1.24)
GFR calc Af Amer: >60 mL/min (ref >60)
GFR calc non Af Amer: >60 mL/min (ref >60)
Glucose, Bld: 114 mg/dL — ABNORMAL HIGH (ref 70–99)
Potassium: 4.1 mmol/L (ref 3.5–5.1)
Sodium: 139 mmol/L (ref 135–145)
Total Bilirubin: 1.7 mg/dL — ABNORMAL HIGH (ref 0.3–1.2)
Total Protein: 7.6 g/dL (ref 6.5–8.1)

## 2020-07-12 LAB — LIPASE, BLOOD: Lipase: 31 U/L (ref 11–51)

## 2020-07-12 LAB — URINALYSIS, COMPLETE (UACMP) WITH MICROSCOPIC
Bacteria, UA: NONE SEEN
Bilirubin Urine: NEGATIVE
Glucose, UA: NEGATIVE mg/dL
Ketones, ur: NEGATIVE mg/dL
Leukocytes,Ua: NEGATIVE
Nitrite: NEGATIVE
Protein, ur: NEGATIVE mg/dL
Specific Gravity, Urine: 1.016 (ref 1.005–1.030)
Squamous Epithelial / HPF: NONE SEEN (ref 0–5)
pH: 7 (ref 5.0–8.0)

## 2020-07-12 NOTE — ED Triage Notes (Signed)
Pt comes via POV from home with c/o abdominal pain due to constipation. Pt states this has been going on for over week.  Pt states severe pain and more pain when trying to eat. Pt states some lower left pain.

## 2020-07-12 NOTE — ED Provider Notes (Signed)
[facility] Emergency Department Provider Note   ____________________________________________   First MD Initiated Contact with Patient 07/12/20 1213     (approximate)  I have reviewed the triage vital signs and the nursing notes.   HISTORY  Chief Complaint Abdominal Pain and Constipation    HPI Derrick Merritt is a 54 y.o. male with a stated past medical history of intermittent constipation who presents for abdominal pain and constipation for the last week.  Patient states he has generalized upper and lower quadrant abdominal pain that is worse after eating and is described as an aching, colicky pain that is 0/96 in severity.  Patient denies any relieving factors and states that he took 2 doses of stool softener in the 2 subsequent days before his arrival in the emergency department with no improvement in his constipation symptoms.         Past Medical History:  Diagnosis Date  . Elevated liver enzymes 03/15/2017  . Hypertension   . Kidney stones   . Olecranon bursitis of left elbow 03/15/2017  . 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 Swelling of joint, wrist, right 05/24/2018   SYNOVIAL THICKENING RIGHT DORSUM WRIST    Patient Active Problem List   Diagnosis Date Noted  . Swelling of joint, wrist, right 05/24/2018  . Elevated liver enzymes 03/15/2017  . Encounter for long-term (current) use of high-risk medication 03/15/2017  . Olecranon bursitis of left elbow 03/15/2017  . 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 LEG SURGERY    . SHOULDER SURGERY      Prior to Admission medications   Medication Sig Start Date End Date Taking? Authorizing Provider  Abatacept (ORENCIA CLICKJECT) 045 MG/ML SOAJ Inject 125 mg into the  skin once a week. 07/04/18   [provider]  calcium carbonate (OSCAL) 1500 (600 Ca) MG TABS tablet Take 1,500 mg by mouth daily with breakfast.    [provider]  naproxen sodium (ALEVE) 220 MG tablet Take 1 tablet by mouth every 6 (six) hours as needed.    [provider]  predniSONE (DELTASONE) 1 MG tablet Take 4 tablets by mouth daily. Take daily with breakfast 09/26/18   [provider]  terazosin (HYTRIN) 10 MG capsule Take 10 mg by mouth at bedtime. Take two capsules by mouth daily at bedtime    [provider]    Allergies Pollen extract  Family History  Problem Relation Age of Onset  . Diabetes Mother   . Hypertension Mother   . Heart disease Mother   . Diabetes Father   . Hypertension Father   . Colon cancer Neg Hx   . Stomach cancer Neg Hx   . Pancreatic cancer Neg Hx   . Esophageal cancer Neg Hx     Social History Social History   Tobacco Use  . Smoking status: Current Every Day Smoker  . Smokeless tobacco: Never Used  . Tobacco comment: 1 ppd   Vaping Use  . Vaping Use: Never used  Substance Use Topics  . Alcohol use: Yes    Comment: occ  . Drug use: Yes    Review of Systems Constitutional: No fever/chills Eyes: No visual changes. ENT: No sore throat. Cardiovascular: Denies chest pain. Respiratory: Denies shortness of breath. Gastrointestinal: Endorses  abdominal pain.  No nausea, no vomiting.  No diarrhea. Genitourinary: Negative for dysuria. Musculoskeletal: Negative for acute arthralgias Skin: Negative for rash. Neurological: Negative for headaches, weakness/numbness/paresthesias in any extremity Psychiatric: Negative for suicidal ideation/homicidal ideation   ____________________________________________   PHYSICAL EXAM:  VITAL SIGNS: ED Triage Vitals  Enc Vitals Group     BP 07/12/20 1038 (!) 160/81     Pulse Rate 07/12/20 1038 75     Resp 07/12/20 1038 18     Temp 07/12/20 1038 97.9 F (36.6  C)     Temp src --      SpO2 07/12/20 1038 97 %     Weight 07/12/20 1036 275 lb (124.7 kg)     Height 07/12/20 1036 6\' 1"  (1.854 m)     Head Circumference --      Peak Flow --      Pain Score 07/12/20 1036 10     Pain Loc --      Pain Edu? --      Excl. in [place]? --    Constitutional: Alert and oriented. Well appearing and in no acute distress. Eyes: Conjunctivae are normal. PERRL. EOMI. Head: Atraumatic. Nose: No congestion/rhinnorhea. Mouth/Throat: Mucous membranes are moist. Neck: No stridor Cardiovascular: Normal rate, regular rhythm. Grossly normal heart sounds.  Good peripheral circulation. Respiratory: Normal respiratory effort.  No retractions. Gastrointestinal: Soft and nontender. No distention. Musculoskeletal: No lower extremity tenderness nor edema.  No joint effusions. Neurologic:  Normal speech and language. No gross focal neurologic deficits are appreciated. Skin:  Skin is warm and dry. No rash noted. Psychiatric: Mood and affect are normal. Speech and behavior are normal.  ____________________________________________   LABS (all labs ordered are listed, but only abnormal results are displayed)  Labs Reviewed  COMPREHENSIVE METABOLIC PANEL - Abnormal; Notable for the following components:      Result Value   Glucose, Bld 114 (*)    Total Bilirubin 1.7 (*)    All other components within normal limits  URINALYSIS, COMPLETE (UACMP) WITH MICROSCOPIC - Abnormal; Notable for the following components:   Color, Urine YELLOW (*)    APPearance HAZY (*)    Hgb urine dipstick MODERATE (*)    All other components within normal limits  LIPASE, BLOOD  CBC   ______________________________________________________________________________   PROCEDURES  Procedure(s) performed (including Critical Care):  Procedures   ____________________________________________   INITIAL IMPRESSION / ASSESSMENT AND PLAN / ED COURSE        Patients history and exam most  consistent with constipation as an etiology for their pain.  Patients symptoms not typical for other emergent causes of abdominal pain such as, but not limited to, appendicitis, abdominal aortic aneurysm, pancreatitis, SBO, mesenteric ischemia, serious intra-abdominal bacterial illness.  Patient without red flags concerning for cancer as a constipation etiology.  Rx: Miralax  Disposition:  Patient will be discharged with strict return precautions and follow up with primary MD within 24-48 hours for further evaluation. Patient understands that this still may have an early presentation of an emergent medical condition such as appendicitis that will require a recheck.      ____________________________________________   FINAL CLINICAL IMPRESSION(S) / ED DIAGNOSES  Final diagnoses:  Constipation, unspecified constipation type  Generalized abdominal pain     ED Discharge Orders    None       Note:  This document was prepared using Dragon voice recognition software and may include unintentional dictation errors.   Naaman Plummer, MD 07/12/20 [phone]

## 2020-07-12 NOTE — Discharge Instructions (Signed)
Please use MiraLAX (polyethylene glycol), one half cap every hour until bowel movement.

## 2020-07-15 ENCOUNTER — Emergency Department
Admission: EM | Admit: 2020-07-15 | Discharge: 2020-07-15 | Disposition: A | Discharge status: Home / Self Care | Payer: BC Managed Care – PPO | Attending: Emergency Medicine | Admitting: Emergency Medicine

## 2020-07-15 ENCOUNTER — Encounter: Payer: Self-pay | Admitting: Emergency Medicine

## 2020-07-15 ENCOUNTER — Emergency Department: Payer: BC Managed Care – PPO

## 2020-07-15 ENCOUNTER — Other Ambulatory Visit: Payer: Self-pay

## 2020-07-15 DIAGNOSIS — Z79899 Other long term (current) drug therapy: Secondary | ICD-10-CM | POA: Insufficient documentation

## 2020-07-15 DIAGNOSIS — I878 Other specified disorders of veins: Secondary | ICD-10-CM | POA: Insufficient documentation

## 2020-07-15 DIAGNOSIS — F17210 Nicotine dependence, cigarettes, uncomplicated: Secondary | ICD-10-CM | POA: Insufficient documentation

## 2020-07-15 DIAGNOSIS — K5900 Constipation, unspecified: Secondary | ICD-10-CM | POA: Diagnosis present

## 2020-07-15 LAB — COMPREHENSIVE METABOLIC PANEL
ALT: 42 U/L (ref 0–44)
AST: 32 U/L (ref 15–41)
Albumin: 4 g/dL (ref 3.5–5.0)
Alkaline Phosphatase: 74 U/L (ref 38–126)
Anion gap: 8 (ref 5–15)
BUN: 13 mg/dL (ref 6–20)
CO2: 28 mmol/L (ref 22–32)
Calcium: 8.9 mg/dL (ref 8.9–10.3)
Chloride: 103 mmol/L (ref 98–111)
Creatinine, Ser: 0.65 mg/dL (ref 0.61–1.24)
GFR calc Af Amer: >60 mL/min (ref >60)
GFR calc non Af Amer: >60 mL/min (ref >60)
Glucose, Bld: 100 mg/dL — ABNORMAL HIGH (ref 70–99)
Potassium: 3.8 mmol/L (ref 3.5–5.1)
Sodium: 139 mmol/L (ref 135–145)
Total Bilirubin: 1.7 mg/dL — ABNORMAL HIGH (ref 0.3–1.2)
Total Protein: 7 g/dL (ref 6.5–8.1)

## 2020-07-15 LAB — CBC
HCT: 39.4 % (ref 39.0–52.0)
Hemoglobin: 13.6 g/dL (ref 13.0–17.0)
MCH: 31.2 pg (ref 26.0–34.0)
MCHC: 34.5 g/dL (ref 30.0–36.0)
MCV: 90.4 fL (ref 80.0–100.0)
Platelets: 146 10*3/uL — ABNORMAL LOW (ref 150–400)
RBC: 4.36 MIL/uL (ref 4.22–5.81)
RDW: 13 % (ref 11.5–15.5)
WBC: 5.6 10*3/uL (ref 4.0–10.5)
nRBC: 0 % (ref 0.0–0.2)

## 2020-07-15 LAB — LIPASE, BLOOD: Lipase: 34 U/L (ref 11–51)

## 2020-07-15 MED ORDER — SENNOSIDES-DOCUSATE SODIUM 8.6-50 MG PO TABS: 1.0000 | ORAL_TABLET | Freq: Every day | ORAL | 0 refills | Status: DC

## 2020-07-15 MED ORDER — MAGNESIUM CITRATE PO SOLN: 1.0000 | Freq: Once | ORAL | 0 refills | Status: AC

## 2020-07-15 NOTE — ED Provider Notes (Signed)
[facility] Emergency Department Provider Note  ____________________________________________  Time seen: Approximately 4:26 PM  I have reviewed the triage vital signs and the nursing notes.   HISTORY  Chief Complaint Constipation    HPI Derrick Merritt is a 54 y.o. male who presents the emergency department complaining of ongoing constipation.  Patient was seen 4 days ago, diagnosed with constipation and started on MiraLAX.  Patient states that has been taking MiraLAX every day with no relief of symptoms.  Patient has a burning/pressure sensation but no frank pain.  No history of abdominal surgeries, bowel obstructions.  Denies taking other medications for his constipation at this time.         Past Medical History:  Diagnosis Date  . Elevated liver enzymes 03/15/2017  . Hypertension   . Kidney stones   . Olecranon bursitis of left elbow 03/15/2017  . 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 Seropositive rheumatoid arthritis ([place]) 03/15/2017   RF +, CCP + RA x 2016 MULTIPLE NODULE FINGERS AND ELBOWS  . Swelling of joint, wrist, right 05/24/2018   SYNOVIAL THICKENING RIGHT DORSUM WRIST    Patient Active Problem List   Diagnosis Date Noted  . Swelling of joint, wrist, right 05/24/2018  . Elevated liver enzymes 03/15/2017  . Encounter for long-term (current) use of high-risk medication 03/15/2017  . Olecranon bursitis of left elbow 03/15/2017  . 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 Seropositive rheumatoid arthritis ([place]) 03/15/2017    Past Surgical History:  Procedure Laterality Date  . LEG SURGERY    . SHOULDER SURGERY      Prior to Admission medications   Medication Sig Start Date End Date Taking? Authorizing Provider  Abatacept (ORENCIA CLICKJECT) 601 MG/ML SOAJ Inject 125 mg into the skin once a week. 07/04/18   [provider]  calcium carbonate (OSCAL) 1500 (600 Ca) MG TABS tablet Take 1,500 mg by mouth daily with breakfast.     [provider]  magnesium citrate SOLN Take 296 mLs (1 Bottle total) by mouth once for 1 dose. 07/15/20 07/15/20  Swade Shonka, Charline Bills, PA-C  naproxen sodium (ALEVE) 220 MG tablet Take 1 tablet by mouth every 6 (six) hours as needed.    [provider]  predniSONE (DELTASONE) 1 MG tablet Take 4 tablets by mouth daily. Take daily with breakfast 09/26/18   [provider]  senna-docusate (SENOKOT-S) 8.6-50 MG tablet Take 1 tablet by mouth daily. 07/15/20   Escher Harr, Charline Bills, PA-C  terazosin (HYTRIN) 10 MG capsule Take 10 mg by mouth at bedtime. Take two capsules by mouth daily at bedtime    [provider]    Allergies Pollen extract  Family History  Problem Relation Age of Onset  . Diabetes Mother   . Hypertension Mother   . Heart disease Mother   . Diabetes Father   . Hypertension Father   . Colon cancer Neg Hx   . Stomach cancer Neg Hx   . Pancreatic cancer Neg Hx   . Esophageal cancer Neg Hx     Social History Social History   Tobacco Use  . Smoking status: Current Every Day Smoker    Packs/day: 1.00    Types: Cigarettes  . Smokeless tobacco: Never Used  Vaping Use  . Vaping Use: Never used  Substance Use Topics  . Alcohol use: Yes  . Drug use: Not Currently     Review of Systems  Constitutional: No fever/chills Eyes: No visual changes. No discharge ENT: No  upper respiratory complaints. Cardiovascular: no chest pain. Respiratory: no cough. No SOB. Gastrointestinal: No frank abdominal pain, bloating/pressure sensation from constipation..  No nausea, no vomiting.  No diarrhea.  Positive constipation. Genitourinary: Negative for dysuria. No hematuria Musculoskeletal: Negative for musculoskeletal pain. Skin: Negative for rash, abrasions, lacerations, ecchymosis. Neurological: Negative for headaches, focal weakness or numbness. 10-point ROS otherwise negative.  ____________________________________________   PHYSICAL  EXAM:  VITAL SIGNS: ED Triage Vitals  Enc Vitals Group     BP 07/15/20 1441 (!) 157/87     Pulse Rate 07/15/20 1441 76     Resp 07/15/20 1441 16     Temp 07/15/20 1441 98.4 F (36.9 C)     Temp Source 07/15/20 1441 Oral     SpO2 07/15/20 1441 95 %     Weight 07/15/20 1442 275 lb (124.7 kg)     Height 07/15/20 1442 6\' 1"  (1.854 m)     Head Circumference --      Peak Flow --      Pain Score 07/15/20 1441 4     Pain Loc --      Pain Edu? --      Excl. in [place]? --      Constitutional: Alert and oriented. Well appearing and in no acute distress. Eyes: Conjunctivae are normal. PERRL. EOMI. Head: Atraumatic. ENT:      Ears:       Nose: No congestion/rhinnorhea.      Mouth/Throat: Mucous membranes are moist.  Neck: No stridor.    Cardiovascular: Normal rate, regular rhythm. Normal S1 and S2.  Good peripheral circulation. Respiratory: Normal respiratory effort without tachypnea or retractions. Lungs CTAB. Good air entry to the bases with no decreased or absent breath sounds. Gastrointestinal: Bowel sounds present 4 quadrants but slightly reduced all quadrants.. Soft and nontender to palpation. No guarding or rigidity. No palpable masses. No distention. No CVA tenderness. Musculoskeletal: Full range of motion to all extremities. No gross deformities appreciated. Neurologic:  Normal speech and language. No gross focal neurologic deficits are appreciated.  Skin:  Skin is warm, dry and intact. No rash noted. Psychiatric: Mood and affect are normal. Speech and behavior are normal. Patient exhibits appropriate insight and judgement.   ____________________________________________   LABS (all labs ordered are listed, but only abnormal results are displayed)  Labs Reviewed  COMPREHENSIVE METABOLIC PANEL - Abnormal; Notable for the following components:      Result Value   Glucose, Bld 100 (*)    Total Bilirubin 1.7 (*)    All other components within normal limits  CBC - Abnormal;  Notable for the following components:   Platelets 146 (*)    All other components within normal limits  LIPASE, BLOOD   ____________________________________________  EKG   ____________________________________________  RADIOLOGY I personally viewed and evaluated these images as part of my medical decision making, as well as reviewing the written report by the radiologist.  DG Abdomen 1 View  Result Date: 07/15/2020 CLINICAL DATA:  Constipation EXAM: ABDOMEN - 1 VIEW COMPARISON:  October 19, 2018 FINDINGS: Incomplete assessment of the lateral soft tissues. Air and stool-filled nondilated loops of bowel. Moderate colonic stool burden diffusely throughout the colon. Visualized lung bases are unremarkable. Pelvic phleboliths. Radiopaque density projecting over the RIGHT renal contour likely reflecting a nephrolithiasis. Degenerative changes of the lumbar spine. IMPRESSION: 1. Nonobstructive bowel gas pattern. 2. Moderate colonic stool burden diffusely throughout the colon. 3. Probable RIGHT nephrolithiasis. Electronically Signed   By: Valentino Saxon MD  On: 07/15/2020 15:11    ____________________________________________    PROCEDURES  Procedure(s) performed:    Procedures    Medications - No data to display   ____________________________________________   INITIAL IMPRESSION / ASSESSMENT AND PLAN / ED COURSE  Pertinent labs & imaging results that were available during my care of the patient were reviewed by me and considered in my medical decision making (see chart for details).  Review of the [place] CSRS was performed in accordance of the [place] prior to dispensing any controlled drugs.           Patient's diagnosis is consistent with constipation.  Patient presented to emergency department with ongoing constipation.  Patient been seen 4 days ago and prescribed MiraLAX.  MiraLAX has not relieved the patient's symptoms.  He reports increasing pressure/bloating sensation  but no frank pain.  Exam was reassuring.  Imaging reveals constipation with no bowel obstructive pattern on x-ray.  Labs are stable.  At this time I offered enema versus more aggressive medications at home.  Patient is requesting additional medical therapy at home at this time but states that if he does not receive relief with medications at home he will return for an enema.  Feel this is reasonable plan.  Patient will be prescribed mag citrate plus Senokot in addition to the MiraLAX.  Drink plenty of fluids.  Eat plenty of fiber.  Follow-up primary care as needed..  Patient is given ED precautions to return to the ED for any worsening or new symptoms.     ____________________________________________  FINAL CLINICAL IMPRESSION(S) / ED DIAGNOSES  Final diagnoses:  Constipation, unspecified constipation type      NEW MEDICATIONS STARTED DURING THIS VISIT:  ED Discharge Orders         Ordered    magnesium citrate SOLN   Once       Note to Pharmacy: 10 bottles   07/15/20 1651    senna-docusate (SENOKOT-S) 8.6-50 MG tablet  Daily        07/15/20 1651              This chart was dictated using voice recognition software/Dragon. Despite best efforts to proofread, errors can occur which can change the meaning. Any change was purely unintentional.    Darletta Moll, PA-C 07/15/20 1655    Delman Kitten, MD 07/15/20 2330

## 2020-07-15 NOTE — ED Triage Notes (Signed)
Pt in via POV, reports ongoing constipation over one week, seen here Sat for same, discharged with instructions to use Miralax.  Reports using Miralax as directed with no relief.  No also presents with left side abdominal pain around to back.  Vitals WDL, NAD noted at this time.

## 2021-12-19 ENCOUNTER — Encounter: Payer: Self-pay | Admitting: Emergency Medicine

## 2021-12-19 ENCOUNTER — Other Ambulatory Visit: Payer: Self-pay

## 2021-12-19 ENCOUNTER — Emergency Department: Payer: BC Managed Care – PPO

## 2021-12-19 ENCOUNTER — Emergency Department
Admission: EM | Admit: 2021-12-19 | Discharge: 2021-12-19 | Disposition: A | Discharge status: Home / Self Care | Payer: BC Managed Care – PPO | Attending: Emergency Medicine | Admitting: Emergency Medicine

## 2021-12-19 DIAGNOSIS — I10 Essential (primary) hypertension: Secondary | ICD-10-CM | POA: Diagnosis not present

## 2021-12-19 DIAGNOSIS — R944 Abnormal results of kidney function studies: Secondary | ICD-10-CM | POA: Insufficient documentation

## 2021-12-19 DIAGNOSIS — D649 Anemia, unspecified: Secondary | ICD-10-CM | POA: Insufficient documentation

## 2021-12-19 DIAGNOSIS — R110 Nausea: Secondary | ICD-10-CM | POA: Diagnosis not present

## 2021-12-19 DIAGNOSIS — N200 Calculus of kidney: Secondary | ICD-10-CM

## 2021-12-19 DIAGNOSIS — E871 Hypo-osmolality and hyponatremia: Secondary | ICD-10-CM | POA: Diagnosis not present

## 2021-12-19 DIAGNOSIS — N132 Hydronephrosis with renal and ureteral calculous obstruction: Secondary | ICD-10-CM | POA: Insufficient documentation

## 2021-12-19 DIAGNOSIS — R1030 Lower abdominal pain, unspecified: Secondary | ICD-10-CM | POA: Diagnosis present

## 2021-12-19 LAB — URINALYSIS, ROUTINE W REFLEX MICROSCOPIC
Bilirubin Urine: NEGATIVE
Glucose, UA: NEGATIVE mg/dL
Ketones, ur: NEGATIVE mg/dL
Leukocytes,Ua: NEGATIVE
Nitrite: NEGATIVE
Protein, ur: 30 mg/dL — AB
RBC / HPF: >50 RBC/hpf — ABNORMAL HIGH (ref 0–5)
Specific Gravity, Urine: 1.029 (ref 1.005–1.030)
Squamous Epithelial / HPF: NONE SEEN (ref 0–5)
pH: 5 (ref 5.0–8.0)

## 2021-12-19 LAB — COMPREHENSIVE METABOLIC PANEL
ALT: 13 U/L (ref 0–44)
AST: 15 U/L (ref 15–41)
Albumin: 3.6 g/dL (ref 3.5–5.0)
Alkaline Phosphatase: 71 U/L (ref 38–126)
Anion gap: 7 (ref 5–15)
BUN: 24 mg/dL — ABNORMAL HIGH (ref 6–20)
CO2: 24 mmol/L (ref 22–32)
Calcium: 8.8 mg/dL — ABNORMAL LOW (ref 8.9–10.3)
Chloride: 100 mmol/L (ref 98–111)
Creatinine, Ser: 1.29 mg/dL — ABNORMAL HIGH (ref 0.61–1.24)
GFR, Estimated: >60 mL/min (ref >60)
Glucose, Bld: 100 mg/dL — ABNORMAL HIGH (ref 70–99)
Potassium: 4.1 mmol/L (ref 3.5–5.1)
Sodium: 131 mmol/L — ABNORMAL LOW (ref 135–145)
Total Bilirubin: 1.3 mg/dL — ABNORMAL HIGH (ref 0.3–1.2)
Total Protein: 7.6 g/dL (ref 6.5–8.1)

## 2021-12-19 LAB — BASIC METABOLIC PANEL
Anion gap: 5 (ref 5–15)
BUN: 22 mg/dL — ABNORMAL HIGH (ref 6–20)
CO2: 26 mmol/L (ref 22–32)
Calcium: 8.2 mg/dL — ABNORMAL LOW (ref 8.9–10.3)
Chloride: 103 mmol/L (ref 98–111)
Creatinine, Ser: 1.17 mg/dL (ref 0.61–1.24)
GFR, Estimated: >60 mL/min (ref >60)
Glucose, Bld: 106 mg/dL — ABNORMAL HIGH (ref 70–99)
Potassium: 4.6 mmol/L (ref 3.5–5.1)
Sodium: 134 mmol/L — ABNORMAL LOW (ref 135–145)

## 2021-12-19 LAB — CBC
HCT: 38.5 % — ABNORMAL LOW (ref 39.0–52.0)
Hemoglobin: 12.7 g/dL — ABNORMAL LOW (ref 13.0–17.0)
MCH: 29.1 pg (ref 26.0–34.0)
MCHC: 33 g/dL (ref 30.0–36.0)
MCV: 88.1 fL (ref 80.0–100.0)
Platelets: 177 10*3/uL (ref 150–400)
RBC: 4.37 MIL/uL (ref 4.22–5.81)
RDW: 12.9 % (ref 11.5–15.5)
WBC: 6.4 10*3/uL (ref 4.0–10.5)
nRBC: 0 % (ref 0.0–0.2)

## 2021-12-19 LAB — LIPASE, BLOOD: Lipase: 40 U/L (ref 11–51)

## 2021-12-19 MED ORDER — FENTANYL CITRATE PF 50 MCG/ML IJ SOSY
25.0000 ug | PREFILLED_SYRINGE | Freq: Once | INTRAMUSCULAR | Status: AC
  Administered 2021-12-19: 25 ug via INTRAVENOUS
  Filled 2021-12-19: qty 1

## 2021-12-19 MED ORDER — TAMSULOSIN HCL 0.4 MG PO CAPS: 0.4000 mg | ORAL_CAPSULE | Freq: Every day | ORAL | 0 refills | Status: AC

## 2021-12-19 MED ORDER — PROMETHAZINE HCL 12.5 MG PO TABS: 12.5000 mg | ORAL_TABLET | Freq: Four times a day (QID) | ORAL | 0 refills | Status: DC | PRN

## 2021-12-19 MED ORDER — HYDROCODONE-ACETAMINOPHEN 5-325 MG PO TABS: 1.0000 | ORAL_TABLET | ORAL | 0 refills | Status: AC | PRN

## 2021-12-19 MED ORDER — SODIUM CHLORIDE 0.9 % IV BOLUS
500.0000 mL | Freq: Once | INTRAVENOUS | Status: AC
  Administered 2021-12-19: 500 mL via INTRAVENOUS

## 2021-12-19 MED ORDER — ONDANSETRON HCL 4 MG/2ML IJ SOLN
4.0000 mg | Freq: Once | INTRAMUSCULAR | Status: AC
  Administered 2021-12-19: 4 mg via INTRAVENOUS
  Filled 2021-12-19: qty 2

## 2021-12-19 MED ORDER — ACETAMINOPHEN 500 MG PO TABS
1000.0000 mg | ORAL_TABLET | Freq: Once | ORAL | Status: AC
Start: 2021-12-19 — End: 2021-12-19
  Administered 2021-12-19: 1000 mg via ORAL
  Filled 2021-12-19: qty 2

## 2021-12-19 MED ORDER — SODIUM CHLORIDE 0.9 % IV BOLUS
1000.0000 mL | Freq: Once | INTRAVENOUS | Status: AC
  Administered 2021-12-19: 1000 mL via INTRAVENOUS

## 2021-12-19 NOTE — Discharge Instructions (Addendum)
-  Take all of your medications as prescribed.  Take Tylenol/ibuprofen as needed for pain.  Use hydrocodone sparingly and only as needed.  Use promethazine for management of nausea. -Return to the emergency department anytime if you begin to experience any new or worsening symptoms

## 2021-12-19 NOTE — ED Triage Notes (Signed)
Pt via POV from home. Pt c/o lower abd pain, blood in his urine, and nausea for the past week. Denies fever. Denies diarrhea. Denies abd surgeries. Pt is A&OX4 and NAD

## 2021-12-19 NOTE — ED Provider Notes (Addendum)
[facility] Provider Note    Event Date/Time   First MD Initiated Contact with Patient 12/19/21 1531     (approximate)   History   Chief Complaint Abdominal Pain   HPI Derrick Merritt is a 56 y.o. male, history of nephrolithiasis and  hypertension, presents to the emergency department for evaluation of abdominal pain.  Patient states that he has been experiencing pain in his right flank and lower abdomen for the past week.  Endorses small amounts of blood in his urine, stating that his urine is pink.  Additionally endorses intermittent nausea, no vomiting.  Patient states that he has a history of kidney stones in the past and that this feels like one of them.  Denies fever/chills, chest pain, shortness of breath, vomiting, diarrhea, blood in stool, rashes/lesions, lightheadedness/dizziness, or headaches.  History Limitations: No limitations.      Physical Exam  Triage Vital Signs: ED Triage Vitals  Enc Vitals Group     BP 12/19/21 1406 139/77     Pulse Rate 12/19/21 1406 88     Resp 12/19/21 1406 16     Temp 12/19/21 1406 98.3 F (36.8 C)     Temp Source 12/19/21 1406 Oral     SpO2 12/19/21 1406 97 %     Weight 12/19/21 1404 275 lb (124.7 kg)     Height 12/19/21 1404 6\' 1"  (1.854 m)     Head Circumference --      Peak Flow --      Pain Score 12/19/21 1404 5     Pain Loc --      Pain Edu? --      Excl. in [place]? --     Most recent vital signs: Vitals:   12/19/21 1406 12/19/21 1728  BP: 139/77 (!) 150/77  Pulse: 88 74  Resp: 16 16  Temp: 98.3 F (36.8 C)   SpO2: 97% 96%    General: Awake, NAD.  CV: Good peripheral perfusion.  Resp: Normal effort.  Mild wheezing bilaterally in the bases.  Patient endorses significant smoking history. Abd: Soft, non-tender. No distention.  Neuro: At baseline. No gross neurological deficits. Other: No CVA tenderness.  Physical Exam    ED Results / Procedures / Treatments  Labs (all labs ordered are  listed, but only abnormal results are displayed) Labs Reviewed  COMPREHENSIVE METABOLIC PANEL - Abnormal; Notable for the following components:      Result Value   Sodium 131 (*)    Glucose, Bld 100 (*)    BUN 24 (*)    Creatinine, Ser 1.29 (*)    Calcium 8.8 (*)    Total Bilirubin 1.3 (*)    All other components within normal limits  CBC - Abnormal; Notable for the following components:   Hemoglobin 12.7 (*)    HCT 38.5 (*)    All other components within normal limits  URINALYSIS, ROUTINE W REFLEX MICROSCOPIC - Abnormal; Notable for the following components:   Color, Urine AMBER (*)    APPearance HAZY (*)    Hgb urine dipstick MODERATE (*)    Protein, ur 30 (*)    RBC / HPF >50 (*)    Bacteria, UA RARE (*)    All other components within normal limits  LIPASE, BLOOD  BASIC METABOLIC PANEL     EKG Not applicable.   RADIOLOGY  ED Provider Interpretation: I personally reviewed and interpreted the CT scan, evidence suggestive of nephrolithiasis with hydronephrosis.  CT Renal  Stone Study  Result Date: 12/19/2021 CLINICAL DATA:  Nephrolithiasis. Lower abdominal pain. Hematuria. Nausea. EXAM: CT ABDOMEN AND PELVIS WITHOUT CONTRAST TECHNIQUE: Multidetector CT imaging of the abdomen and pelvis was performed following the standard protocol without IV contrast. RADIATION DOSE REDUCTION: This exam was performed according to the departmental dose-optimization program which includes automated exposure control, adjustment of the mA and/or kV according to patient size and/or use of iterative reconstruction technique. COMPARISON:  10/19/2018 FINDINGS: Lower chest: Lung bases are clear. Hepatobiliary: Question slight nodular surface the liver which could suggest early cirrhosis. No focal lesion. Subcentimeter stone dependent in the gallbladder. Pancreas: Normal Spleen: Normal Adrenals/Urinary Tract: Adrenal glands are normal. Left kidney is normal. No cyst, mass, stone or hydronephrosis on that  side. Right kidney contains 5 small nonobstructing stones, the largest measuring 3.5 mm in the midportion. There is hydroureteronephrosis because of a 7.7 mm stone at the proximal ureter just beyond the UPJ. No stone seen distal to that. No stone in the bladder. Stomach/Bowel: The stomach and small intestine are normal. Normal appendix. Diverticulosis of the left colon without evidence of diverticulitis. Small periumbilical hernia containing fat, with mild edema. This could possibly be asymptomatic hernia. No bowel involvement. Vascular/Lymphatic: Aortic atherosclerosis. No aneurysm. IVC is normal. No adenopathy. Reproductive: Normal Other: No free fluid or air. Musculoskeletal: Ordinary lower lumbar degenerative changes. IMPRESSION: Hydronephrosis on the right because of a 7.7 mm stone in the proximal ureter just past the UPJ. 5 other small nonobstructing stones in the right kidney. Left kidney is normal. Question early changes of cirrhosis of the liver. Slight nodularity of the surface. Small dependent calcified gallstone. No sign of cholecystitis or obstruction at this time. Aortic Atherosclerosis (ICD10-I70.0). Small periumbilical hernia containing fat, which is somewhat edematous. This could possibly be symptomatic hernia. Electronically Signed   By: Nelson Chimes M.D.   On: 12/19/2021 16:26    PROCEDURES:  Critical Care performed: None.  Procedures    MEDICATIONS ORDERED IN ED: Medications  sodium chloride 0.9 % bolus 1,000 mL (0 mLs Intravenous Stopped 12/19/21 1719)  acetaminophen (TYLENOL) tablet 1,000 mg (1,000 mg Oral Given 12/19/21 1606)  sodium chloride 0.9 % bolus 500 mL (500 mLs Intravenous New Bag/Given 12/19/21 1723)  fentaNYL (SUBLIMAZE) injection 25 mcg (25 mcg Intravenous Given 12/19/21 1722)  ondansetron (ZOFRAN) injection 4 mg (4 mg Intravenous Given 12/19/21 1721)     IMPRESSION / MDM / ASSESSMENT AND PLAN / ED COURSE  I reviewed the triage vital signs and the nursing notes.                               Derrick Merritt is a 56 y.o. male, history of nephrolithiasis and  hypertension, presents to the emergency department for evaluation of abdominal pain.  Patient states that he has been experiencing pain in his right flank and lower abdomen for the past week.  Endorses small amounts of blood in his urine, stating that his urine is pink.  Additionally endorses intermittent nausea, no vomiting.  Patient states that he has a history of kidney stones in the past and that this feels like one of them.  Differential diagnosis includes, but is not limited to, nephrolithiasis, cystitis, pyelonephritis, renal infarction, bladder cancer.   ED Course Patient appears well.  Vital signs within normal limits.  Mild pain at this time.  We will treat with p.o. acetaminophen.  CBC unremarkable for leukocytosis or clinically significant anemia.  CMP notable for hyponatremia at 131 and elevated creatinine at 1.29.  We will go ahead and initiate IV fluids.  No evidence of transaminitis or other significant electrolyte abnormalities.  Lipase unremarkable at 40.  Unlikely pancreatitis.  Urinalysis notable for moderate amounts of hemoglobin, consistent with patient's endorsement of hematuria.  Otherwise no evidence of infection.  CT scan notable for 7.7 mm calculus in the proximal ureter with hydronephrosis.  See above for details for other incidental findings.  Spoke with the on-call urologist, Hollice Espy, who advised that they would follow-up with the patient and see him in clinic in the next 3 to 4 days.    BMP reevaluated after 1.5 L IV fluids.  Creatinine normalized at 1.17.  Sodium elevated to 134.  Assessment/Plan Presentation is consistent with nephrolithiasis.  No evidence of infection.  Work-up not suggestive of any serious or life-threatening pathology. Patient was notified of all incidental findings on the CT scan.  Patient was treated here with pain medication and fluids.   Urology agreed to follow-up with him in clinic.  We will plan to discharge him with pain/nausea medication.  Patient was provided with anticipatory guidance, return precautions, and educational material. Encouraged the patient to return to the emergency department at any time if they begin to experience any new or worsening symptoms.       FINAL CLINICAL IMPRESSION(S) / ED DIAGNOSES   Final diagnoses:  Nephrolithiasis     Rx / DC Orders   ED Discharge Orders          Ordered    tamsulosin (FLOMAX) 0.4 MG CAPS capsule  Daily        12/19/21 1714    HYDROcodone-acetaminophen (NORCO/VICODIN) 5-325 MG tablet  Every 4 hours PRN        12/19/21 1714    promethazine (PHENERGAN) 12.5 MG tablet  Every 6 hours PRN        12/19/21 1714             Note:  This document was prepared using Dragon voice recognition software and may include unintentional dictation errors.   Teodoro Spray, [place] 12/19/21 [place], [place], [place] 12/19/21 Pollie Meyer    Carrie Mew, MD 12/20/21 [phone]

## 2021-12-21 ENCOUNTER — Telehealth: Payer: Self-pay

## 2021-12-21 ENCOUNTER — Other Ambulatory Visit: Payer: Self-pay

## 2021-12-21 DIAGNOSIS — Z87442 Personal history of urinary calculi: Secondary | ICD-10-CM

## 2021-12-21 NOTE — Telephone Encounter (Signed)
-----   Message from Arie Sabina sent at 12/21/2021  9:45 AM EST ----- Regarding: RE: F/ u stone Patient wants to know why he has to have the KUB as he recently had a CT? Please advise patient   ----- Message ----- From: Gordy Clement, [place] Sent: 12/21/2021   9:06 AM EST To: Billey Co, MD, Arie Sabina Subject: RE: F/ u stone                                 Thanks, KUB ordered.   -OG   ----- Message ----- From: Billey Co, MD Sent: 12/21/2021   7:45 AM EST To: Georga Hacking, CMA Subject: FW: F/ u stone                                 Traci-please add him to my [place] schedule tomorrow with KUB prior, he should stop aspirin and anti-inflammatories like Aleve/ibuprofen/Motrin/Toradol Monday night so that we can potentially do shockwave lithotripsy Thursday.  Thanks  [facility]  Nickolas Madrid, MD 12/21/2021    ----- Message ----- From: Hollice Espy, MD Sent: 12/19/2021   4:59 PM EST To: Billey Co, MD, Arie Sabina Subject: F/ u stone                                     This patient is a patient of Dr. Willene Hatchet (seen 2020) who was in the ER on Saturday with an acute stone episode. He needs a new patient visit (seen more than 3 years ago now) ideally with [facility] ASAP early this week to discuss management of his stone.  Caryl Pina

## 2021-12-21 NOTE — Telephone Encounter (Signed)
Called pt explained to him the need for KUB, pt voiced understanding. Order placed.

## 2021-12-22 ENCOUNTER — Ambulatory Visit (INDEPENDENT_AMBULATORY_CARE_PROVIDER_SITE_OTHER): Payer: BC Managed Care – PPO | Admitting: Urology

## 2021-12-22 ENCOUNTER — Other Ambulatory Visit: Payer: Self-pay | Admitting: *Deleted

## 2021-12-22 ENCOUNTER — Encounter: Payer: Self-pay | Admitting: Urology

## 2021-12-22 ENCOUNTER — Other Ambulatory Visit
Admission: RE | Admit: 2021-12-22 | Discharge: 2021-12-22 | Disposition: A | Discharge status: Home / Self Care | Payer: BC Managed Care – PPO | Source: Home / Self Care | Attending: Urology | Admitting: Urology

## 2021-12-22 ENCOUNTER — Other Ambulatory Visit: Payer: Self-pay

## 2021-12-22 ENCOUNTER — Ambulatory Visit
Admission: RE | Admit: 2021-12-22 | Discharge: 2021-12-22 | Disposition: A | Discharge status: Home / Self Care | Payer: BC Managed Care – PPO | Attending: Urology | Admitting: Urology

## 2021-12-22 ENCOUNTER — Other Ambulatory Visit: Payer: Self-pay | Admitting: Urology

## 2021-12-22 ENCOUNTER — Ambulatory Visit
Admission: RE | Admit: 2021-12-22 | Discharge: 2021-12-22 | Disposition: A | Discharge status: Home / Self Care | Payer: BC Managed Care – PPO | Source: Ambulatory Visit | Attending: Urology | Admitting: Urology

## 2021-12-22 VITALS — BP 161/77 | HR 90 | Ht 72.0 in | Wt 257.8 lb

## 2021-12-22 DIAGNOSIS — N200 Calculus of kidney: Secondary | ICD-10-CM | POA: Diagnosis not present

## 2021-12-22 DIAGNOSIS — Z87442 Personal history of urinary calculi: Secondary | ICD-10-CM

## 2021-12-22 DIAGNOSIS — N201 Calculus of ureter: Secondary | ICD-10-CM

## 2021-12-22 LAB — URINALYSIS, COMPLETE (UACMP) WITH MICROSCOPIC
Glucose, UA: NEGATIVE mg/dL
Ketones, ur: NEGATIVE mg/dL
Leukocytes,Ua: NEGATIVE
Nitrite: NEGATIVE
Protein, ur: NEGATIVE mg/dL
Specific Gravity, Urine: 1.025 (ref 1.005–1.030)
pH: 5.5 (ref 5.0–8.0)

## 2021-12-22 MED ORDER — HYDROCODONE-ACETAMINOPHEN 5-325 MG PO TABS: 1.0000 | ORAL_TABLET | Freq: Four times a day (QID) | ORAL | 0 refills | Status: AC | PRN

## 2021-12-22 NOTE — Patient Instructions (Signed)
Lithotripsy Lithotripsy is a treatment that can help break up kidney stones that are too large to pass on their own. This is a nonsurgical procedure that crushes a kidney stone with shock waves. These shock waves pass through your body and focus on the kidney stone. They cause the kidney stone to break up into smaller pieces while it is still in the urinary tract. The smaller pieces of stone can pass more easily out of your body in the urine. Tell a health care provider about: Any allergies you have. All medicines you are taking, including vitamins, herbs, eye drops, creams, and over-the-counter medicines. Any problems you or family members have had with anesthetic medicines. Any blood disorders you have. Any surgeries you have had. Any medical conditions you have. Whether you are pregnant or may be pregnant. What are the risks? Generally, this is a safe procedure. However, problems may occur, including: Infection. Bleeding from the kidney. Bruising of the kidney or skin. Scarring of the kidney, which can lead to: Increased blood pressure. Poor kidney function. Return (recurrence) of kidney stones. Damage to other structures or organs, such as the liver, colon, spleen, or pancreas. Blockage (obstruction) of the tube that carries urine from the kidney to the bladder (ureter). Failure of the kidney stone to break into pieces (fragments). What happens before the procedure? Staying hydrated Follow instructions from your health care provider about hydration, which may include: Up to 2 hours before the procedure - you may continue to drink clear liquids, such as water, clear fruit juice, black coffee, and plain tea. Eating and drinking restrictions Follow instructions from your health care provider about eating and drinking, which may include: 8 hours before the procedure - stop eating heavy meals or foods, such as meat, fried foods, or fatty foods. 6 hours before the procedure - stop eating  light meals or foods, such as toast or cereal. 6 hours before the procedure - stop drinking milk or drinks that contain milk. 2 hours before the procedure - stop drinking clear liquids. Medicines Ask your health care provider about: Changing or stopping your regular medicines. This is especially important if you are taking diabetes medicines or blood thinners. Taking medicines such as aspirin and ibuprofen. These medicines can thin your blood. Do not take these medicines unless your health care provider tells you to take them. Taking over-the-counter medicines, vitamins, herbs, and supplements. Tests You may have tests, such as: Blood tests. Urine tests. Imaging tests, such as a CT scan. General instructions Plan to have someone take you home from the hospital or clinic. If you will be going home right after the procedure, plan to have someone with you for 24 hours. Ask your health care provider what steps will be taken to help prevent infection. These may include washing skin with a germ-killing soap. What happens during the procedure?  An IV will be inserted into one of your veins. You will be given one or more of the following: A medicine to help you relax (sedative). A medicine to make you fall asleep (general anesthetic). A water-filled cushion may be placed behind your kidney or on your abdomen. In some cases, you may be placed in a tub of lukewarm water. Your body will be positioned in a way that makes it easy to target the kidney stone. An X-ray or ultrasound exam will be done to locate your stone. Shock waves will be aimed at the stone. If you are awake, you may feel a tapping sensation as  the shock waves pass through your body. A flexible tube with holes in it (stent) may be placed in the ureter. This will help keep urine flowing from the kidney if the fragments of the stone have been blocking the ureter. The procedure may vary among health care providers and hospitals. What  happens after the procedure? You may have an X-ray to see whether the procedure was able to break up the kidney stone and how much of the stone has passed. If large stone fragments remain after treatment, you may need to have a second procedure at a later time. Your blood pressure, heart rate, breathing rate, and blood oxygen level will be monitored until you leave the hospital or clinic. You may be given antibiotics or pain medicine as needed. If a stent was placed in your ureter during surgery, it may stay in place for a few weeks. You may need to strain your urine to collect pieces of the kidney stone for testing. You will need to drink plenty of water. If you were given a sedative during the procedure, it can affect you for several hours. Do not drive or operate machinery until your health care provider says that it is safe. Summary Lithotripsy is a treatment that can help break up kidney stones that are too large to pass on their own. Lithotripsy is a nonsurgical procedure that crushes a kidney stone with shock waves. Generally, this is a safe procedure. However, problems may occur, including damage to the kidney or other organs, infection, or obstruction of the tube that carries urine from the kidney to the bladder (ureter). You may have a stent placed in your ureter to help drain your urine. This stent may stay in place for a few weeks. After the procedure, you will need to drink plenty of water. You may be asked to strain your urine to collect pieces of the kidney stone for testing. This information is not intended to replace advice given to you by your health care provider. Make sure you discuss any questions you have with your health care provider. Document Revised: 07/24/2019 Document Reviewed: 07/25/2019 Elsevier Patient Education  [place].

## 2021-12-22 NOTE — H&P (View-Only) (Signed)
12/22/21 4:32 PM   Omer Jack 1966-06-15 [id number]  CC: Right-sided flank pain, right ureteral stone  HPI: 56 year old male who I previously saw in January 2020 after he spontaneously passed a 5 mm stone.  He presents with right-sided flank pain and CT on 12/19/2021 in the ER showed a 8 mm right proximal ureteral stone with upstream hydronephrosis.  He is continued to have some right-sided flank pain and intermittent gross hematuria.  KUB today shows persistent stone in the proximal ureter.  He denies any fevers or chills.  No clinical or laboratory evidence of infection at ER visit.  He has not had any prior surgeries for kidney stones.   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   Seropositive rheumatoid arthritis ([place]) 03/15/2017   RF +, CCP + RA x 2016 MULTIPLE NODULE FINGERS AND ELBOWS   Swelling of joint, wrist, right 05/24/2018   SYNOVIAL THICKENING RIGHT DORSUM WRIST    Surgical History: Past Surgical History:  Procedure Laterality Date   LEG SURGERY     SHOULDER SURGERY       Family History: Family History  Problem Relation Age of Onset   Diabetes Mother    Hypertension Mother    Heart disease Mother    Diabetes Father    Hypertension Father    Colon cancer Neg Hx    Stomach cancer Neg Hx    Pancreatic cancer Neg Hx    Esophageal cancer Neg Hx     Social History:  reports that he has been smoking cigarettes. He has been smoking an average of 1 pack per day. He has never used smokeless tobacco. He reports current alcohol use. He reports that he does not currently use drugs.  Physical Exam: BP (!) 161/77 (BP Location: Left Arm, Patient Position: Sitting, Cuff Size: Large)    Pulse 90    Ht 6' (1.829 m)    Wt 257 lb 12.8 oz (116.9 kg)    BMI 34.96 kg/m    Constitutional:  Alert and oriented, No acute  distress. Cardiovascular: Regular rate and rhythm Respiratory: Clear to auscultation bilaterally GI: Abdomen is soft, nontender, nondistended, no abdominal masses   Laboratory Data: Reviewed, see HPI  Pertinent Imaging: I have personally viewed and interpreted the CT showing a 8 mm right UPJ stone, clearly seen on KUB and unchanged location today, 1000HU, 14cm SSD.  Assessment & Plan:   56 year old male with 8 mm right UPJ stone for the last 1 to 2 weeks with persistent right-sided flank pain, no change in stone location on KUB today.  He would like to avoid stent if at all possible, and is interested in shockwave lithotripsy.  We discussed various treatment options for urolithiasis including observation with or without medical expulsive therapy, shockwave lithotripsy (SWL), ureteroscopy and laser lithotripsy with stent placement, and percutaneous nephrolithotomy.  We discussed that management is based on stone size, location, density, patient co-morbidities, and patient preference.   Stones <57mm in size have a >80% spontaneous passage rate. Data surrounding the use of tamsulosin for medical expulsive therapy is controversial, but meta analyses suggests it is most efficacious for distal stones between 5-60mm in size. Possible side effects include dizziness/lightheadedness, and retrograde ejaculation.  SWL has a lower stone free rate in a single procedure, but also a lower complication rate compared  to ureteroscopy and avoids a stent and associated stent related symptoms. Possible complications include renal hematoma, steinstrasse, and need for additional treatment.  Ureteroscopy with laser lithotripsy and stent placement has a higher stone free rate than SWL in a single procedure, however increased complication rate including possible infection, ureteral injury, bleeding, and stent related morbidity. Common stent related symptoms include dysuria, urgency/frequency, and flank pain.  After an  extensive discussion of the risks and benefits of the above treatment options, the patient would like to proceed with right shockwave lithotripsy this week   Nickolas Madrid, MD 12/22/2021  [facility] [address], [place], [place] [address] [phone]

## 2021-12-22 NOTE — Progress Notes (Signed)
ESWL ORDER FORM  Expected date of procedure: 12/24/2021  Surgeon: Nickolas Madrid, MD  Post op standing: 2-4wk follow up w/KUB prior with PA  Anticoagulation/Aspirin/NSAID standing order: Hold all 72 hours prior  Anesthesia standing order: MAC  VTE standing: SCD's  Dx: Right Ureteral Stone  Procedure: right Extracorporeal shock wave lithotripsy  CPT : 73220  Standing Order Set:   *NPO after mn, KUB  *NS 154m/hr, Keflex 5028mPO, Benadryl 2540mO, Valium 68m46m, Zofran 4mg 64m   Medications if other than standing orders:   NONE

## 2021-12-22 NOTE — Progress Notes (Signed)
12/22/21 4:32 PM   Derrick Merritt 25-Jul-1966 [id number]  CC: Right-sided flank pain, right ureteral stone  HPI: 56 year old male who I previously saw in January 2020 after he spontaneously passed a 5 mm stone.  He presents with right-sided flank pain and CT on 12/19/2021 in the ER showed a 8 mm right proximal ureteral stone with upstream hydronephrosis.  He is continued to have some right-sided flank pain and intermittent gross hematuria.  KUB today shows persistent stone in the proximal ureter.  He denies any fevers or chills.  No clinical or laboratory evidence of infection at ER visit.  He has not had any prior surgeries for kidney stones.   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   Seropositive rheumatoid arthritis ([place]) 03/15/2017   RF +, CCP + RA x 2016 MULTIPLE NODULE FINGERS AND ELBOWS   Swelling of joint, wrist, right 05/24/2018   SYNOVIAL THICKENING RIGHT DORSUM WRIST    Surgical History: Past Surgical History:  Procedure Laterality Date   LEG SURGERY     SHOULDER SURGERY       Family History: Family History  Problem Relation Age of Onset   Diabetes Mother    Hypertension Mother    Heart disease Mother    Diabetes Father    Hypertension Father    Colon cancer Neg Hx    Stomach cancer Neg Hx    Pancreatic cancer Neg Hx    Esophageal cancer Neg Hx     Social History:  reports that he has been smoking cigarettes. He has been smoking an average of 1 pack per day. He has never used smokeless tobacco. He reports current alcohol use. He reports that he does not currently use drugs.  Physical Exam: BP (!) 161/77 (BP Location: Left Arm, Patient Position: Sitting, Cuff Size: Large)    Pulse 90    Ht 6' (1.829 m)    Wt 257 lb 12.8 oz (116.9 kg)    BMI 34.96 kg/m    Constitutional:  Alert and oriented, No acute  distress. Cardiovascular: Regular rate and rhythm Respiratory: Clear to auscultation bilaterally GI: Abdomen is soft, nontender, nondistended, no abdominal masses   Laboratory Data: Reviewed, see HPI  Pertinent Imaging: I have personally viewed and interpreted the CT showing a 8 mm right UPJ stone, clearly seen on KUB and unchanged location today, 1000HU, 14cm SSD.  Assessment & Plan:   56 year old male with 8 mm right UPJ stone for the last 1 to 2 weeks with persistent right-sided flank pain, no change in stone location on KUB today.  He would like to avoid stent if at all possible, and is interested in shockwave lithotripsy.  We discussed various treatment options for urolithiasis including observation with or without medical expulsive therapy, shockwave lithotripsy (SWL), ureteroscopy and laser lithotripsy with stent placement, and percutaneous nephrolithotomy.  We discussed that management is based on stone size, location, density, patient co-morbidities, and patient preference.   Stones <51mm in size have a >80% spontaneous passage rate. Data surrounding the use of tamsulosin for medical expulsive therapy is controversial, but meta analyses suggests it is most efficacious for distal stones between 5-56mm in size. Possible side effects include dizziness/lightheadedness, and retrograde ejaculation.  SWL has a lower stone free rate in a single procedure, but also a lower complication rate compared  to ureteroscopy and avoids a stent and associated stent related symptoms. Possible complications include renal hematoma, steinstrasse, and need for additional treatment.  Ureteroscopy with laser lithotripsy and stent placement has a higher stone free rate than SWL in a single procedure, however increased complication rate including possible infection, ureteral injury, bleeding, and stent related morbidity. Common stent related symptoms include dysuria, urgency/frequency, and flank pain.  After an  extensive discussion of the risks and benefits of the above treatment options, the patient would like to proceed with right shockwave lithotripsy this week   Nickolas Madrid, MD 12/22/2021  [facility] [address], [place], [place] [address] [phone]

## 2021-12-23 MED ORDER — CEPHALEXIN 500 MG PO CAPS: 500.0000 mg | ORAL_CAPSULE | Freq: Once | ORAL | Status: AC

## 2021-12-23 MED ORDER — ONDANSETRON HCL 4 MG/2ML IJ SOLN: 4.0000 mg | Freq: Once | INTRAMUSCULAR | Status: AC

## 2021-12-23 MED ORDER — SODIUM CHLORIDE 0.9 % IV SOLN: INTRAVENOUS | Status: DC

## 2021-12-23 MED ORDER — DIPHENHYDRAMINE HCL 25 MG PO CAPS: 25.0000 mg | ORAL_CAPSULE | ORAL | Status: AC

## 2021-12-23 MED ORDER — DIAZEPAM 5 MG PO TABS: 10.0000 mg | ORAL_TABLET | ORAL | Status: AC

## 2021-12-24 ENCOUNTER — Ambulatory Visit (HOSPITAL_COMMUNITY): Payer: BC Managed Care – PPO

## 2021-12-24 ENCOUNTER — Encounter: Payer: Self-pay | Admitting: Urology

## 2021-12-24 ENCOUNTER — Encounter
Admission: RE | Disposition: A | Discharge status: Home / Self Care | Payer: Self-pay | Source: Home / Self Care | Attending: Urology

## 2021-12-24 ENCOUNTER — Other Ambulatory Visit: Payer: Self-pay

## 2021-12-24 ENCOUNTER — Ambulatory Visit: Payer: BC Managed Care – PPO

## 2021-12-24 ENCOUNTER — Ambulatory Visit
Admission: RE | Admit: 2021-12-24 | Discharge: 2021-12-24 | Disposition: A | Discharge status: Home / Self Care | Payer: BC Managed Care – PPO | Attending: Urology | Admitting: Urology

## 2021-12-24 DIAGNOSIS — F17210 Nicotine dependence, cigarettes, uncomplicated: Secondary | ICD-10-CM | POA: Insufficient documentation

## 2021-12-24 DIAGNOSIS — N201 Calculus of ureter: Secondary | ICD-10-CM | POA: Diagnosis present

## 2021-12-24 HISTORY — PX: EXTRACORPOREAL SHOCK WAVE LITHOTRIPSY: SHX1557

## 2021-12-24 SURGERY — LITHOTRIPSY, ESWL
Anesthesia: Moderate Sedation | Laterality: Right

## 2021-12-24 MED ORDER — DIAZEPAM 5 MG PO TABS
ORAL_TABLET | ORAL | Status: AC
  Administered 2021-12-24: 10 mg via ORAL
  Filled 2021-12-24: qty 2

## 2021-12-24 MED ORDER — ONDANSETRON HCL 4 MG/2ML IJ SOLN
INTRAMUSCULAR | Status: AC
  Administered 2021-12-24: 4 mg via INTRAVENOUS
  Filled 2021-12-24: qty 2

## 2021-12-24 MED ORDER — CEPHALEXIN 500 MG PO CAPS
ORAL_CAPSULE | ORAL | Status: AC
  Administered 2021-12-24: 500 mg via ORAL
  Filled 2021-12-24: qty 1

## 2021-12-24 MED ORDER — HYDROCODONE-ACETAMINOPHEN 5-325 MG PO TABS: 1.0000 | ORAL_TABLET | Freq: Four times a day (QID) | ORAL | 0 refills | Status: AC | PRN

## 2021-12-24 MED ORDER — DIPHENHYDRAMINE HCL 25 MG PO CAPS
ORAL_CAPSULE | ORAL | Status: AC
  Administered 2021-12-24: 25 mg via ORAL
  Filled 2021-12-24: qty 1

## 2021-12-24 NOTE — Interval H&P Note (Signed)
UROLOGY H&P UPDATE ? ?Agree with prior H&P dated 12/22/21, 65mm right UPJ stone and ongoing renal colic. ? ?Cardiac: RRR ?Lungs: CTA bilaterally ? ?Laterality: Right ?Procedure: Right ureteroscopy, laser lithotripsy, stent placement ? ?Urine: UA 12/22/21 benign ? ?Informed consent obtained, we specifically discussed the risks of bleeding/hematoma, infection, post-operative pain/obstructive fragments, need for additional procedures. ? ?Billey Co, MD ?12/24/2021 ? ? ? ?

## 2021-12-24 NOTE — Discharge Instructions (Signed)
AMBULATORY SURGERY  ?DISCHARGE INSTRUCTIONS ? ? ?The drugs that you were given will stay in your system until tomorrow so for the next 24 hours you should not: ? ?Drive an automobile ?Make any legal decisions ?Drink any alcoholic beverage ? ? ?You may resume regular meals tomorrow.  Today it is better to start with liquids and gradually work up to solid foods. ? ?You may eat anything you prefer, but it is better to start with liquids, then soup and crackers, and gradually work up to solid foods. ? ? ?Please notify your doctor immediately if you have any unusual bleeding, trouble breathing, redness and pain at the surgery site, drainage, fever, or pain not relieved by medication. ? ? ? ?Additional Instructions: ? ? ? ?Please contact your physician with any problems or Same Day Surgery at [phone], Monday through Friday 6 am to 4 pm, or [facility] number at [phone].  ?

## 2021-12-24 NOTE — Brief Op Note (Signed)
12/24/2021 ? ?10:57 AM ? ?PATIENT:  Derrick Merritt  56 y.o. male ? ?PRE-OPERATIVE DIAGNOSIS:  73mm Right proximal Ureteral Stone ? ?POST-OPERATIVE DIAGNOSIS:  Same ? ?PROCEDURE:  Procedure(s): ?EXTRACORPOREAL SHOCK WAVE LITHOTRIPSY (ESWL) (Right) ? ?SURGEON:  Surgeon(s) and Role: ?   * Jeidy Hoerner, Herbert Seta, MD - Primary ? ?ANESTHESIA: Conscious Sedation ? ?EBL:  None ? ?Drains: None ? ?Specimen: None ? ?Findings:  ?Challenging SWL secondary to body habitus, had to move shockhead anterior, tolerated SWL well ? ?DISPO: ?Flomax, pain meds PRN, RTC 2 weeks KUB ? ?Nickolas Madrid, MD ?12/24/2021 ? ? ? ? ? ?

## 2021-12-25 ENCOUNTER — Encounter: Payer: Self-pay | Admitting: Urology

## 2021-12-25 ENCOUNTER — Other Ambulatory Visit: Payer: Self-pay

## 2021-12-25 DIAGNOSIS — N201 Calculus of ureter: Secondary | ICD-10-CM

## 2022-01-19 NOTE — Progress Notes (Signed)
01/27/22 ?3:41 PM  ? ?Derrick Merritt ?[birth year] ?[id number] ? ?Referring provider:  ?Leonard Downing, MD ?[address] ?[place],  [place] [address] ?\ ?Chief Complaint  ?Patient presents with  ? Nephrolithiasis  ? ?Urological history: ?Nephrolithiasis  ?- CT on 12/19/2021 in the ER showed a 8 mm right proximal ureteral stone with upstream hydronephrosis.  ?- KUB on 12/22/2021 shows persistent stone in the proximal ureter.  ?- He is s/p ESWL on 12/24/2021 ? ?HPI: ?Derrick Merritt is a 56 y.o.male who presents today a post op follow-up with KUB. ? ?KUB today was personally reviewed and visualized in right iliac theres is a linear calcification that may represent the a smudge of stone fragment below the pelvic frame there are two calcification that are overlayed with stool but can represent right ureteral stones.  ? ?He brought stone fragments today. Patient denies any modifying or aggravating factors.  Patient denies any gross hematuria, dysuria or suprapubic/flank pain.  Patient denies any fevers, chills, nausea or vomiting.  ? ? ?PMH: ?Past Medical History:  ?Diagnosis Date  ? Elevated liver enzymes 03/15/2017  ? Hypertension   ? Kidney stones   ? Olecranon bursitis of left elbow 03/15/2017  ? 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 Seropositive rheumatoid arthritis ([place]) 03/15/2017  ? RF +, CCP + RA x 2016 MULTIPLE NODULE FINGERS AND ELBOWS  ? Swelling of joint, wrist, right 05/24/2018  ? SYNOVIAL THICKENING RIGHT DORSUM WRIST  ? ? ?Surgical History: ?Past Surgical History:  ?Procedure Laterality Date  ? EXTRACORPOREAL SHOCK WAVE LITHOTRIPSY Right 12/24/2021  ? Procedure: EXTRACORPOREAL SHOCK WAVE LITHOTRIPSY (ESWL);  Surgeon: Billey Co, MD;  Location: [facility] ORS;  Service: Urology;  Laterality: Right;  ? LEG SURGERY    ? SHOULDER SURGERY    ? ? ?Home Medications:  ?Allergies as of 01/20/2022   ? ?   Reactions  ? Pollen Extract Rash  ? ?  ? ?  ?Medication List  ?  ? ?  ? Accurate as of January 20, 2022 11:59 PM. If  you have any questions, ask your nurse or doctor.  ?  ?  ? ?  ? ?STOP taking these medications   ? ?promethazine 12.5 MG tablet ?Commonly known as: PHENERGAN ?  ? ?  ? ?TAKE these medications   ? ?Abatacept 125 MG/ML Soaj ?Inject 125 mg into the skin once a week. ORENCIA ?  ?atorvastatin 10 MG tablet ?Commonly known as: LIPITOR ?Take 10 mg by mouth daily. ?  ?bisacodyl 5 MG EC tablet ?Commonly known as: DULCOLAX ?Take 5 mg by mouth daily as needed for moderate constipation. ?  ?calcium carbonate 1500 (600 Ca) MG Tabs tablet ?Commonly known as: OSCAL ?Take 1,500 mg by mouth daily with breakfast. ?  ?finasteride 5 MG tablet ?Commonly known as: PROSCAR ?Take 5 mg by mouth daily. ?  ?HYDROcodone-acetaminophen 5-325 MG tablet ?Commonly known as: NORCO/VICODIN ?Take 1 tablet by mouth every 4 (four) hours as needed for moderate pain. ?  ?predniSONE 1 MG tablet ?Commonly known as: DELTASONE ?Take 4 tablets by mouth daily. Take daily with breakfast ?  ?terazosin 10 MG capsule ?Commonly known as: HYTRIN ?Take 2 mg by mouth at bedtime. ?  ? ?  ? ? ?Allergies:  ?Allergies  ?Allergen Reactions  ? Pollen Extract Rash  ? ? ?Family History: ?Family History  ?Problem Relation Age of Onset  ? Diabetes Mother   ? Hypertension Mother   ? Heart disease Mother   ? Diabetes  Father   ? Hypertension Father   ? Colon cancer Neg Hx   ? Stomach cancer Neg Hx   ? Pancreatic cancer Neg Hx   ? Esophageal cancer Neg Hx   ? ? ?Social History:  reports that he has been smoking cigarettes. He has been smoking an average of 1 pack per day. He has never used smokeless tobacco. He reports current alcohol use. He reports that he does not currently use drugs. ? ? ?Physical Exam: ?BP (!) 175/84   Pulse 79   Ht '6\' 1"'$  (1.854 m)   Wt 257 lb (116.6 kg)   BMI 33.91 kg/m?   ?Constitutional:  Alert and oriented, No acute distress. ?HEENT: [place] AT, moist mucus membranes.  Trachea midline, no masses. ?Cardiovascular: No clubbing, cyanosis, or edema. ?Respiratory:  Normal respiratory effort, no increased work of breathing. ?Skin: No rashes, bruises or suspicious lesions. ?Neurologic: Grossly intact, no focal deficits, moving all 4 extremities. ?Psychiatric: Normal mood and affect. ? ? ?Laboratory Data: ? ?Lab Results  ?Component Value Date  ? CREATININE 1.17 12/19/2021  ? ?Component ?    Latest Ref Rng 12/19/2021  ?Sodium ?    135 - 145 mmol/L 134 (L)   ?Sodium ?     131 (L)   ?Potassium ?    3.5 - 5.1 mmol/L 4.6   ?Potassium ?     4.1   ?Chloride ?    98 - 111 mmol/L 103   ?Chloride ?     100   ?CO2 ?    22 - 32 mmol/L 26   ?CO2 ?     24   ?Glucose ?    70 - 99 mg/dL 106 (H)   ?Glucose ?     100 (H)   ?BUN ?    6 - 20 mg/dL 22 (H)   ?BUN ?     24 (H)   ?Creatinine ?    0.61 - 1.24 mg/dL 1.17   ?Creatinine ?     1.29 (H)   ?Calcium ?    8.9 - 10.3 mg/dL 8.2 (L)   ?Calcium ?     8.8 (L)   ?Anion gap ?    5 - 15  5   ?Anion gap ?     7   ?GFR, Estimated ?    >60 mL/min >60   ?GFR, Estimated ?     >60   ?  ?(L) Low ?(H) High ? ? ?Pertinent Imaging: ?CLINICAL DATA:  POST ESWL, Ureteral Stone ?  ?EXAM: ?ABDOMEN - 1 VIEW ?  ?COMPARISON:  X-ray abdomen 12/24/2021, CT renal 12/19/2021 ?  ?FINDINGS: ?The bowel gas pattern is normal. Phleboliths again noted overlying ?the pelvis. Previously identified right ureteral stone overlying the ?right mid abdomen not visualized. Query some calcifications ?overlying the right renal shadow that could represent ?nephrolithiasis. ?  ?IMPRESSION: ?1. Previously identified right ureteral stone overlying the right ?mid abdomen not visualized. Limited evaluation due to overlapping ?osseous structures and overlying soft tissues. ?2. Nonobstructive bowel gas pattern. ?  ?  ?Electronically Signed ?  By: Iven Finn M.D. ?  On: 01/21/2022 21:36 ?I have independently reviewed the films.  See HPI.   ? ?Assessment & Plan:   ? ?Right UPJ stone  ?- S/p ESWL on 12/24/2021 ?- He brought stone fragments in today will send for analysis  ?- KUB today shows over in  right iliac theres is a linear calcification that may represent the a smudge of  stone fragment below the pelvic frame there are two calcification that are overlayed with stool but can represent right ureteral stones. Will monitor with KUB in 1 month  ? ? ?Return in about 1 month (around 02/20/2022) for KUB . ? ?[facility] ?[address] ?[place], [place] [address] ?[phone] ? ?I,Kailey Littlejohn,acting as a Education administrator for [employer], PA-C.,have documented all relevant documentation on the behalf of [employer], PA-C,as directed by  [facility], PA-C while in the presence of Kallan Bischoff, PA-C. ?

## 2022-01-20 ENCOUNTER — Ambulatory Visit (INDEPENDENT_AMBULATORY_CARE_PROVIDER_SITE_OTHER): Payer: BC Managed Care – PPO | Admitting: Urology

## 2022-01-20 ENCOUNTER — Ambulatory Visit
Admission: RE | Admit: 2022-01-20 | Discharge: 2022-01-20 | Disposition: A | Discharge status: Home / Self Care | Payer: BC Managed Care – PPO | Attending: Urology | Admitting: Urology

## 2022-01-20 ENCOUNTER — Ambulatory Visit
Admission: RE | Admit: 2022-01-20 | Discharge: 2022-01-20 | Disposition: A | Discharge status: Home / Self Care | Payer: BC Managed Care – PPO | Source: Ambulatory Visit | Attending: Urology | Admitting: Urology

## 2022-01-20 VITALS — BP 175/84 | HR 79 | Ht 73.0 in | Wt 257.0 lb

## 2022-01-20 DIAGNOSIS — N201 Calculus of ureter: Secondary | ICD-10-CM

## 2022-01-27 ENCOUNTER — Encounter: Payer: Self-pay | Admitting: Urology

## 2022-01-27 LAB — CALCULI, WITH PHOTOGRAPH (CLINICAL LAB)
Calcium Oxalate Dihydrate: 10 %
Calcium Oxalate Monohydrate: 60 %
Hydroxyapatite: 30 %
Weight Calculi: 16 mg

## 2022-02-16 NOTE — Progress Notes (Signed)
02/18/22 ?4:21 PM  ? ?Derrick Merritt ?1966-04-01 ?[id number] ? ?Referring provider:  ?Leonard Downing, MD ?[address] ?[place],  [place] [address] ? ?Chief Complaint  ?Patient presents with  ? Nephrolithiasis  ? ?Urological history: ?Nephrolithiasis  ?-  stone composition calcium oxalate ?- CT on 12/19/2021 in the ER showed a 8 mm right proximal ureteral stone with upstream hydronephrosis.  ?- KUB on 12/22/2021 shows persistent stone in the proximal ureter.  ?- He is s/p ESWL on 12/24/2021 ? ?HPI: ?Derrick Merritt is a 56 y.o.male who presents today a post op follow-up with KUB. ? ?He has not passed any further fragments.  He has no flank pain.  Patient denies any modifying or aggravating factors.  Patient denies any gross hematuria, dysuria or suprapubic/flank pain.  Patient denies any fevers, chills, nausea or vomiting.   ? ?PMH: ?Past Medical History:  ?Diagnosis Date  ? Elevated liver enzymes 03/15/2017  ? Hypertension   ? Kidney stones   ? Olecranon bursitis of left elbow 03/15/2017  ? 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 RF +, CCP + RA x 2016 MULTIPLE NODULE FINGERS AND ELBOWS  ? Swelling of joint, wrist, right 05/24/2018  ? SYNOVIAL THICKENING RIGHT DORSUM WRIST  ? ? ?Surgical History: ?Past Surgical History:  ?Procedure Laterality Date  ? EXTRACORPOREAL SHOCK WAVE LITHOTRIPSY Right 12/24/2021  ? Procedure: EXTRACORPOREAL SHOCK WAVE LITHOTRIPSY (ESWL);  Surgeon: Billey Co, MD;  Location: [facility] ORS;  Service: Urology;  Laterality: Right;  ? LEG SURGERY    ? SHOULDER SURGERY    ? ? ?Home Medications:  ?Allergies as of 02/17/2022   ? ?   Reactions  ? Pollen Extract Rash  ? ?  ? ?  ?Medication List  ?  ? ?  ? Accurate as of February 17, 2022 11:59 PM. If you have any questions, ask your nurse or doctor.  ?  ?  ? ?  ? ?Abatacept 125 MG/ML Soaj ?Inject 125 mg into the skin once a week. ORENCIA ?  ?atorvastatin 10 MG tablet ?Commonly known as: LIPITOR ?Take  10 mg by mouth daily. ?  ?bisacodyl 5 MG EC tablet ?Commonly known as: DULCOLAX ?Take 5 mg by mouth daily as needed for moderate constipation. ?  ?calcium carbonate 1500 (600 Ca) MG Tabs tablet ?Commonly known as: OSCAL ?Take 1,500 mg by mouth daily with breakfast. ?  ?finasteride 5 MG tablet ?Commonly known as: PROSCAR ?Take 5 mg by mouth daily. ?  ?HYDROcodone-acetaminophen 5-325 MG tablet ?Commonly known as: NORCO/VICODIN ?Take 1 tablet by mouth every 4 (four) hours as needed for moderate pain. ?  ?predniSONE 1 MG tablet ?Commonly known as: DELTASONE ?Take 4 tablets by mouth daily. Take daily with breakfast ?  ?terazosin 10 MG capsule ?Commonly known as: HYTRIN ?Take 2 mg by mouth at bedtime. ?  ? ?  ? ? ?Allergies:  ?Allergies  ?Allergen Reactions  ? Pollen Extract Rash  ? ? ?Family History: ?Family History  ?Problem Relation Age of Onset  ? Diabetes Mother   ? Hypertension Mother   ? Heart disease Mother   ? Diabetes Father   ? Hypertension Father   ? Colon cancer Neg Hx   ? Stomach cancer Neg Hx   ? Pancreatic cancer Neg Hx   ? Esophageal cancer Neg Hx   ? ? ?Social History:  reports that he has been smoking cigarettes. He has been smoking an average of 1 pack  per day. He has never used smokeless tobacco. He reports current alcohol use. He reports that he does not currently use drugs. ? ? ?Physical Exam: ?BP (!) 179/87   Pulse 72   Ht '6\' 1"'$  (1.854 m)   Wt 255 lb (115.7 kg)   BMI 33.64 kg/m?   ?Constitutional:  Well nourished. Alert and oriented, No acute distress. ?HEENT: [place] AT, moist mucus membranes.  Trachea midline ?Cardiovascular: No clubbing, cyanosis, or edema. ?Respiratory: Normal respiratory effort, no increased work of breathing. ?Neurologic: Grossly intact, no focal deficits, moving all 4 extremities. ?Psychiatric: Normal mood and affect.  ? ?Laboratory Data: ?N/A ? ? ?Pertinent Imaging: ?CLINICAL DATA:  Right ureteral stone ?  ?EXAM: ?ABDOMEN - 1 VIEW ?  ?COMPARISON:  Previous studies including  abdominal radiograph done on ?01/20/2022 and CT done on 12/19/2021 ?  ?FINDINGS: ?Bowel gas pattern is nonspecific. Moderate amount of stool is seen ?in the colon without fecal impaction in the rectum. No abnormal ?masses or calcifications are seen. Kidneys are partly obscured by ?bowel contents. There are no demonstrable calcific densities in the ?courses of the ureters. Phleboliths are seen in the left side of ?pelvis. Visualized lower lung fields are unremarkable. Degenerative ?changes are noted in the lower lumbar spine. ?  ?IMPRESSION: ?Nonspecific bowel gas pattern. No abnormal calcifications are noted ?in the courses of the ureters. Kidneys are partly obscured by bowel ?contents limiting evaluation for small renal stones. ?  ?  ?Electronically Signed ?  By: Elmer Picker M.D. ?  On: 02/18/2022 16:00  ?I have independently reviewed the films.  See HPI.   ? ?Assessment & Plan:   ? ?Right UPJ stone  ?- s/p ESWL on 12/24/2021 ?- no more fragments seen on KUB ?-Discussed general stone prevention and gave ABC's of stone prevention booklet to patient ?-He is not interested in 62-IWLN metabolic work-up ? ? ?Return in about 6 months (around 08/19/2022) for KUB . ? ?[facility] ?[address] ?[place] [address] ?[phone] ? ?

## 2022-02-17 ENCOUNTER — Ambulatory Visit
Admission: RE | Admit: 2022-02-17 | Discharge: 2022-02-17 | Disposition: A | Discharge status: Home / Self Care | Payer: BC Managed Care – PPO | Source: Ambulatory Visit | Attending: Urology | Admitting: Urology

## 2022-02-17 ENCOUNTER — Ambulatory Visit
Admission: RE | Admit: 2022-02-17 | Discharge: 2022-02-17 | Disposition: A | Discharge status: Home / Self Care | Payer: BC Managed Care – PPO | Attending: Urology | Admitting: Urology

## 2022-02-17 ENCOUNTER — Ambulatory Visit (INDEPENDENT_AMBULATORY_CARE_PROVIDER_SITE_OTHER): Payer: BC Managed Care – PPO | Admitting: Urology

## 2022-02-17 ENCOUNTER — Encounter: Payer: Self-pay | Admitting: Urology

## 2022-02-17 VITALS — BP 179/87 | HR 72 | Ht 73.0 in | Wt 255.0 lb

## 2022-02-17 DIAGNOSIS — N201 Calculus of ureter: Secondary | ICD-10-CM | POA: Insufficient documentation

## 2022-02-17 NOTE — Patient Instructions (Signed)
"  Penalty" List  Bathroom Penalty: An extra 4-ounce glass of water with every visit to the bathroom, regardless of the reason. Chair Penalty: Have a bottle of water and a 4-ounce glass next to their favorite chairs at home. Instruct the patient to drink one 4-ounce glass before getting up from the chair. Cheating Penalty: If they eat or drink a restricted item beyond the allowable limit, one extra glass of water. Kitchen Penalty: One glass of water whenever they walk into their kitchen. Leaving Home Penalty. One extra glass of water prior to leaving home and another when they return. Meal Penalty: One extra glass with each meal except when they eat out, where they will need two extra glasses. Medication Penalty: One extra glass of water when taking medications. This is in addition to whatever patients are instructed to take normally with their medications. Nighttime Bathroom Use Penalty: One glass of water whenever they get out of bed to go to the bathroom at night. Snack Penalty: One extra glass of water if they have a snack between meals or bedtime. Summertime Penalty: Double all other penalties during the summer or when outside temperatures exceed 85 degrees. Time Penalty: One glass of water if the patient has managed to avoid all other "penalties" for 2 hours during the daytime. Water Fountain Penalty: They must drink at least 5 swallows whenever they pass a water fountain. Work Penalty: One glass of water whenever they leave their main desk or workplace. Patients should always have water and a 4-ounce glass available on their desks, in their cars, or within easy reach.  

## 2022-03-26 ENCOUNTER — Encounter: Payer: Self-pay | Admitting: Gastroenterology

## 2022-08-24 ENCOUNTER — Ambulatory Visit: Payer: BC Managed Care – PPO | Admitting: Urology

## 2022-11-22 ENCOUNTER — Encounter: Payer: Self-pay | Admitting: Gastroenterology

## 2022-11-23 ENCOUNTER — Ambulatory Visit (AMBULATORY_SURGERY_CENTER): Payer: BC Managed Care – PPO | Admitting: *Deleted

## 2022-11-23 VITALS — Ht 73.0 in | Wt 227.0 lb

## 2022-11-23 DIAGNOSIS — Z86010 Personal history of colonic polyps: Secondary | ICD-10-CM

## 2022-11-23 MED ORDER — NA SULFATE-K SULFATE-MG SULF 17.5-3.13-1.6 GM/177ML PO SOLN: 1.0000 | Freq: Once | ORAL | 0 refills | Status: AC

## 2022-11-23 NOTE — Progress Notes (Signed)
No egg or soy allergy known to patient  No issues known to pt with past sedation with any surgeries or procedures Patient denies ever being told they had issues or difficulty with intubation  No FH of Malignant Hyperthermia Pt is not on diet pills Pt is not on  home 02  Pt is not on blood thinners  Pt denies issues with constipation  Pt is not on dialysis Pt denies any upcoming cardiac testing Pt encouraged to use to use Singlecare or Goodrx to reduce cost  Patient's chart reviewed by Hong Nulty CNRA prior to previsit and patient appropriate for the [facility].  Previsit completed and red dot placed by patient's name on their procedure day (on provider's schedule).  . Visit by phone Instructions reviewed with pt and pt states understanding. Instructed to review again prior to procedure. Pt states they will.  Instructions sent by mail with coupon 

## 2022-12-13 ENCOUNTER — Encounter: Payer: Self-pay | Admitting: Gastroenterology

## 2022-12-19 ENCOUNTER — Encounter: Payer: Self-pay | Admitting: Certified Registered Nurse Anesthetist

## 2022-12-22 ENCOUNTER — Telehealth: Payer: Self-pay | Admitting: *Deleted

## 2022-12-22 ENCOUNTER — Encounter: Payer: Self-pay | Admitting: Gastroenterology

## 2022-12-22 ENCOUNTER — Ambulatory Visit (AMBULATORY_SURGERY_CENTER): Payer: BC Managed Care – PPO | Admitting: Gastroenterology

## 2022-12-22 VITALS — BP 146/86 | HR 61 | Temp 98.9°F | Resp 13 | Ht 73.0 in | Wt 227.0 lb

## 2022-12-22 DIAGNOSIS — D123 Benign neoplasm of transverse colon: Secondary | ICD-10-CM

## 2022-12-22 DIAGNOSIS — D122 Benign neoplasm of ascending colon: Secondary | ICD-10-CM | POA: Diagnosis not present

## 2022-12-22 DIAGNOSIS — D124 Benign neoplasm of descending colon: Secondary | ICD-10-CM | POA: Diagnosis not present

## 2022-12-22 DIAGNOSIS — Z86010 Personal history of colonic polyps: Secondary | ICD-10-CM

## 2022-12-22 DIAGNOSIS — Z09 Encounter for follow-up examination after completed treatment for conditions other than malignant neoplasm: Secondary | ICD-10-CM | POA: Diagnosis present

## 2022-12-22 MED ORDER — SODIUM CHLORIDE 0.9 % IV SOLN: 500.0000 mL | Freq: Once | INTRAVENOUS | Status: DC

## 2022-12-22 NOTE — Telephone Encounter (Signed)
Referral placed to genetic for colon polyps.

## 2022-12-22 NOTE — Progress Notes (Signed)
Referring Provider: Leonard Downing, * Primary Care Physician:  Leonard Downing, MD  Indication for Procedure:  Colon cancer Surveillance   IMPRESSION:  Need for colon cancer surveillance Appropriate candidate for monitored anesthesia care  PLAN: Colonoscopy in the [place] today   HPI: Derrick Merritt is a 57 y.o. male presents for surveillance colonoscopy.  Prior endoscopic history: Colonoscopy 03/22/19: 4 tubular adenomas  No baseline GI symptoms.   No known family history of colon cancer or polyps. No family history of uterine/endometrial cancer, pancreatic cancer or gastric/stomach cancer.   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 ORENCIA     albuterol (VENTOLIN HFA) 108 (90 Base) MCG/ACT inhaler Inhale into the lungs.     atorvastatin (LIPITOR) 10 MG tablet Take 10 mg by mouth daily.     calcium carbonate (OSCAL) 1500 (600 Ca) MG TABS tablet Take 1,500 mg by mouth daily with breakfast.     finasteride (PROSCAR) 5 MG tablet Take 5 mg by mouth daily.     lisinopril (ZESTRIL) 10 MG tablet Take by mouth.     predniSONE (DELTASONE) 1 MG tablet Take 4 tablets by mouth daily. Take daily with  breakfast     terazosin (HYTRIN) 10 MG capsule Take 2 mg by mouth at bedtime.     bisacodyl (DULCOLAX) 5 MG EC tablet Take 5 mg by mouth daily as needed for moderate constipation. (Patient not taking: Reported on 11/23/2022)     Current Facility-Administered Medications  Medication Dose Route Frequency Provider Last Rate Last Admin   0.9 %  sodium chloride infusion  500 mL Intravenous Once Thornton Park, MD       0.9 %  sodium chloride infusion  500 mL Intravenous Once Thornton Park, MD        Allergies as of 12/22/2022 - Review Complete 12/22/2022  Allergen Reaction Noted   Pollen extract Rash 03/15/2017    Family History  Problem Relation Age of Onset   Diabetes Mother    Hypertension Mother    Heart disease Mother    Diabetes Father    Hypertension Father    Colon cancer Neg Hx    Stomach cancer Neg Hx    Pancreatic cancer Neg Hx    Esophageal cancer Neg Hx    Colon polyps Neg Hx      Physical Exam: General:   Alert,  well-nourished, pleasant and cooperative in NAD Head:  Normocephalic and atraumatic. Eyes:  Sclera clear, no icterus.   Conjunctiva pink. Mouth:  No deformity or lesions.   Neck:  Supple; no masses or thyromegaly. Lungs:  Clear throughout to auscultation.   No wheezes. Heart:  Regular rate and rhythm; no murmurs. Abdomen:  Soft, non-tender, nondistended, normal bowel sounds, no rebound or guarding.  Msk:  Symmetrical. No boney deformities LAD: No inguinal or umbilical LAD Extremities:  No clubbing or edema. Neurologic:  Alert and  oriented x4;  grossly nonfocal Skin:  No obvious rash or bruise. Psych:  Alert and cooperative. Normal mood and affect.     Studies/Results: No results found.    [name] L. Tarri Glenn, MD, MPH 12/22/2022, 9:32 AM

## 2022-12-22 NOTE — Patient Instructions (Signed)
Resume previous diet and medications. Awaiting pathology results. Repeat Colonoscopy date to be determined based on pathology results.  Handouts given [id number] polyps, Diverticulosis and hemorrhoids.   YOU HAD AN ENDOSCOPIC PROCEDURE TODAY AT [facility]:   Refer to the procedure report that was given to you for any specific questions about what was found during the examination.  If the procedure report does not answer your questions, please call your gastroenterologist to clarify.  If you requested that your care partner not be given the details of your procedure findings, then the procedure report has been included in a sealed envelope for you to review at your convenience later.  YOU SHOULD EXPECT: Some feelings of bloating in the abdomen. Passage of more gas than usual.  Walking can help get rid of the air that was put into your GI tract during the procedure and reduce the bloating. If you had a lower endoscopy (such as a colonoscopy or flexible sigmoidoscopy) you may notice spotting of blood in your stool or on the toilet paper. If you underwent a bowel prep for your procedure, you may not have a normal bowel movement for a few days.  Please Note:  You might notice some irritation and congestion in your nose or some drainage.  This is from the oxygen used during your procedure.  There is no need for concern and it should clear up in a day or so.  SYMPTOMS TO REPORT IMMEDIATELY:  Following lower endoscopy (colonoscopy or flexible sigmoidoscopy):  Excessive amounts of blood in the stool  Significant tenderness or worsening of abdominal pains  Swelling of the abdomen that is new, acute  Fever of 100F or higher  For urgent or emergent issues, a gastroenterologist can be reached at any hour by calling [phone]. Do not use MyChart messaging for urgent concerns.    DIET:  We do recommend a small meal at first, but then you may proceed to your regular diet.  Drink plenty of  fluids but you should avoid alcoholic beverages for 24 hours.  ACTIVITY:  You should plan to take it easy for the rest of today and you should NOT DRIVE or use heavy machinery until tomorrow (because of the sedation medicines used during the test).    FOLLOW UP: Our staff will call the number listed on your records the next business day following your procedure.  We will call around 7:15- 8:00 am to check on you and address any questions or concerns that you may have regarding the information given to you following your procedure. If we do not reach you, we will leave a message.     If any biopsies were taken you will be contacted by phone or by letter within the next 1-3 weeks.  Please call us at [phone] if you have not heard about the biopsies in 3 weeks.    SIGNATURES/CONFIDENTIALITY: You and/or your care partner have signed paperwork which will be entered into your electronic medical record.  These signatures attest to the fact that that the information above on your After Visit Summary has been reviewed and is understood.  Full responsibility of the confidentiality of this discharge information lies with you and/or your care-partner.

## 2022-12-22 NOTE — Progress Notes (Signed)
Report given to PACU, vss 

## 2022-12-22 NOTE — Progress Notes (Signed)
Pt's states no medical or surgical changes since previsit or office visit. 

## 2022-12-22 NOTE — Progress Notes (Signed)
Called to room to assist during endoscopic procedure.  Patient ID and intended procedure confirmed with present staff. Received instructions for my participation in the procedure from the performing physician.  

## 2022-12-22 NOTE — Op Note (Signed)
[facility] Patient Name: Derrick Merritt Procedure Date: 12/22/2022 9:44 AM MRN: [id number] Endoscopist: Thornton Park MD, MD, [id number] Age: 57 Referring MD:  Date of Birth: Mar 25, 1966 Gender: Male Account #: [id number] Procedure:                Colonoscopy Indications:              High risk colon cancer surveillance: Personal                            history of multiple (3 or more) adenomas                           4 tubular adenomas on colonoscopy 2020                           No known family history of colon cancer or polyps Medicines:                Monitored Anesthesia Care Procedure:                Pre-Anesthesia Assessment:                           - Prior to the procedure, a History and Physical                            was performed, and patient medications and                            allergies were reviewed. The patient's tolerance of                            previous anesthesia was also reviewed. The risks                            and benefits of the procedure and the sedation                            options and risks were discussed with the patient.                            All questions were answered, and informed consent                            was obtained. Prior Anticoagulants: The patient has                            taken no anticoagulant or antiplatelet agents. ASA                            Grade Assessment: II - A patient with mild systemic                            disease. After reviewing the risks and benefits,  the patient was deemed in satisfactory condition to                            undergo the procedure.                           After obtaining informed consent, the colonoscope                            was passed under direct vision. Throughout the                            procedure, the patient's blood pressure, pulse, and                            oxygen saturations were monitored  continuously. The                            Olympus CF-HQ190L [id number]) Colonoscope was                            introduced through the anus and advanced to the 3                            cm into the ileum. A second forward view of the                            right colon was performed. The colonoscopy was                            performed without difficulty. The patient tolerated                            the procedure well. The quality of the bowel                            preparation was good. The terminal ileum, ileocecal                            valve, appendiceal orifice, and rectum were                            photographed. Scope In: 9:47:39 AM Scope Out: 10:07:40 AM Scope Withdrawal Time: 0 hours 17 minutes 34 seconds  Total Procedure Duration: 0 hours 20 minutes 1 second  Findings:                 The perianal and digital rectal examinations were                            normal.                           Non-bleeding internal hemorrhoids were found.  A few small-mouthed diverticula were found in the                            sigmoid colon.                           Four sessile and flat polyps were found in the                            descending colon. The polyps were 1 to 2 mm in                            size. These polyps were removed with a cold snare.                            Resection and retrieval were complete. Estimated                            blood loss was minimal.                           Two sessile and flat polyps were found in the                            transverse colon. The polyps were 1 to 2 mm in                            size. These polyps were removed with a cold snare.                            Resection and retrieval were complete. Estimated                            blood loss was minimal.                           A 2 mm polyp was found in the ascending colon. The                             polyp was flat. The polyp was removed with a cold                            snare. Resection and retrieval were complete.                            Estimated blood loss was minimal.                           The exam was otherwise without abnormality on                            direct and retroflexion views. Complications:            No immediate complications.  Estimated Blood Loss:     Estimated blood loss was minimal. Impression:               - Non-bleeding internal hemorrhoids.                           - Diverticulosis in the sigmoid colon.                           - Four 1 to 2 mm polyps in the descending colon,                            removed with a cold snare. Resected and retrieved.                           - Two 1 to 2 mm polyps in the transverse colon,                            removed with a cold snare. Resected and retrieved.                           - One 2 mm polyp in the ascending colon, removed                            with a cold snare. Resected and retrieved.                           - The examination was otherwise normal on direct                            and retroflexion views. Recommendation:           - Patient has a contact number available for                            emergencies. The signs and symptoms of potential                            delayed complications were discussed with the                            patient. Return to normal activities tomorrow.                            Written discharge instructions were provided to the                            patient.                           - High fiber diet.                           - Continue present medications.                           -  Await pathology results.                           - Repeat colonoscopy in 3 years for surveillance.                           - Emerging evidence supports eating a diet of                            fruits, vegetables, grains, calcium, and yogurt                             while reducing red meat and alcohol may reduce the                            risk of colon cancer.                           - Given the number of polyps removed today and in                            2020, I recommend that you consult with a genetic                            counselor.                           - Thank you for allowing me to be involved in your                            colon cancer prevention. Thornton Park MD, MD 12/22/2022 10:15:09 AM This report has been signed electronically.

## 2022-12-22 NOTE — Telephone Encounter (Signed)
-----   Message from Thornton Park, MD sent at 12/22/2022 10:08 AM EST ----- Please arrange referral for genetic testing given >10 polyps  Thanks.  KLB

## 2022-12-23 ENCOUNTER — Telehealth: Payer: Self-pay | Admitting: Genetic Counselor

## 2022-12-23 ENCOUNTER — Telehealth: Payer: Self-pay | Admitting: *Deleted

## 2022-12-23 NOTE — Telephone Encounter (Signed)
  Follow up Call-     12/22/2022    9:04 AM  Call back number  Post procedure Call Back phone  # [phone]  Permission to leave phone message Yes     Patient questions:  Do you have a fever, pain , or abdominal swelling? No. Pain Score  0 *  Have you tolerated food without any problems? Yes.    Have you been able to return to your normal activities? Yes.    Do you have any questions about your discharge instructions: Diet   No. Medications  No. Follow up visit  No.  Do you have questions or concerns about your Care? No.  Actions: * If pain score is 4 or above: No action needed, pain <4.

## 2022-12-23 NOTE — Telephone Encounter (Signed)
Scheduled appt per 2/28 referral. Pt is aware of appt date and time. Pt is aware to arrive 15 mins prior to appt time and to bring and updated insurance card. Pt is aware of appt location.   °

## 2022-12-26 ENCOUNTER — Encounter: Payer: Self-pay | Admitting: Gastroenterology

## 2023-02-24 ENCOUNTER — Inpatient Hospital Stay: Payer: BC Managed Care – PPO

## 2023-02-24 ENCOUNTER — Other Ambulatory Visit: Payer: Self-pay

## 2023-02-24 ENCOUNTER — Other Ambulatory Visit: Payer: Self-pay | Admitting: Genetic Counselor

## 2023-02-24 ENCOUNTER — Inpatient Hospital Stay: Payer: BC Managed Care – PPO | Admitting: Genetic Counselor

## 2023-02-24 DIAGNOSIS — Z809 Family history of malignant neoplasm, unspecified: Secondary | ICD-10-CM

## 2023-02-24 DIAGNOSIS — Z1379 Encounter for other screening for genetic and chromosomal anomalies: Secondary | ICD-10-CM

## 2023-02-24 DIAGNOSIS — Z86010 Personal history of colonic polyps: Secondary | ICD-10-CM | POA: Diagnosis not present

## 2023-02-24 LAB — GENETIC SCREENING ORDER

## 2023-02-25 ENCOUNTER — Encounter: Payer: Self-pay | Admitting: Genetic Counselor

## 2023-02-25 NOTE — Progress Notes (Signed)
REFERRING PROVIDER: Tressia Danas, MD [address] [place],  [place] [address]  PRIMARY PROVIDER:  Kaleen Mask, MD  PRIMARY REASON FOR VISIT:  1. Personal history of colonic polyps   2. Family history of cancer    HISTORY OF PRESENT ILLNESS:   Derrick Merritt, a 57 y.o. male, was seen for a [facility] cancer genetics consultation at the request of Dr. Orvan Falconer due to a personal history of colon polyps.  Derrick Merritt presents to clinic today to discuss the possibility of a hereditary predisposition to cancer, to discuss genetic testing, and to further clarify his future cancer risks, as well as potential cancer risks for family members.   Derrick Merritt had a colonoscopy in 2020 that identified 4 tubular adenomas and a second colonoscopy in 2024 that identified 7 tubular adenomas.   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   RF +, CCP + RA x 2016 MULTIPLE NODULE FINGERS AND ELBOWS   Swelling of joint, wrist, right 05/24/2018   SYNOVIAL THICKENING RIGHT DORSUM WRIST    Past Surgical History:  Procedure Laterality Date   EXTRACORPOREAL SHOCK WAVE LITHOTRIPSY Right 12/24/2021   Procedure: EXTRACORPOREAL SHOCK WAVE LITHOTRIPSY (ESWL);  Surgeon: Sondra Come, MD;  Location: [facility] ORS;  Service: Urology;  Laterality: Right;   LEG SURGERY     SHOULDER SURGERY      Social History   Socioeconomic History   Marital status: Married    Spouse name: Not on file   Number of children: Not on file   Years of education: Not on file   Highest education level: Not on file  Occupational History   Not on file  Tobacco Use   Smoking status: Former    Packs/day: 1    Types: Cigarettes    Quit date: 07/25/2022    Years since quitting: 0.5   Smokeless tobacco: Never  Vaping Use   Vaping  Use: Never used  Substance and Sexual Activity   Alcohol use: Yes   Drug use: Not Currently   Sexual activity: Yes    Partners: Female  Other Topics Concern   Not on file  Social History Narrative   Not on file   Social Determinants of Health   Financial Resource Strain: Not on file  Food Insecurity: Not on file  Transportation Needs: Not on file  Physical Activity: Not on file  Stress: Not on file  Social Connections: Not on file     FAMILY HISTORY:  We obtained a detailed, 4-generation family history.  Significant diagnoses are listed below: Family History  Problem Relation Age of Onset   Diabetes Mother    Hypertension Mother    Heart disease Mother    Diabetes Father    Hypertension Father    Cancer Maternal Aunt        unknown type   Cancer Maternal Uncle        unknown type   Lung cancer Cousin 47       maternal first cousin   Colon cancer Neg Hx    Stomach cancer Neg Hx    Pancreatic cancer Neg Hx    Esophageal cancer Neg Hx    Colon polyps Neg Hx       Derrick Merritt reports one maternal aunt and one  maternal uncle with a history of cancer (unknown type), they are deceased. He reports no known family history of colon polyps but has limited information about his family medical history. Derrick Merritt is unaware of previous family history of genetic testing for hereditary cancer risks. There is no reported Ashkenazi Jewish ancestry.   GENETIC COUNSELING ASSESSMENT: Derrick Merritt is a 57 y.o. male with a personal history of colon polyps which is somewhat suggestive of a hereditary predisposition to cancer given >10 tubular adenomas. We, therefore, discussed and recommended the following at today's visit.   DISCUSSION: We discussed that 5 - 10% of cancer is hereditary, with most cases of polyposis associated with APC or MUTYH.  There are other genes that can be associated with hereditary polyposis syndromes.  We discussed that testing is beneficial for several reasons,  including knowing about cancer risks, identifying potential screening and risk-reduction options that may be appropriate, and to understanding if other family members could be at risk for cancer/polyposis and allowing them to undergo genetic testing.  We reviewed the characteristics, features and inheritance patterns of hereditary cancer syndromes. We also discussed genetic testing, including the appropriate family members to test, the process of testing, insurance coverage and turn-around-time for results. We discussed the implications of a negative, positive, carrier and/or variant of uncertain significant result. We recommended Derrick Merritt pursue genetic testing for a panel that includes genes associated with polyposis.   Derrick Merritt was offered a common hereditary cancer panel (34 genes) and an expanded pan-cancer panel (71 genes). Derrick Merritt was informed of the benefits and limitations of each panel, including that expanded pan-cancer panels contain genes that do not have clear management guidelines at this point in time.  We also discussed that as the number of genes included on a panel increases, the chances of variants of uncertain significance increases. After considering the benefits and limitations of each gene panel, Derrick Merritt elected to have [facility] CancerNext Panel.  The CancerNext gene panel offered by [employer] includes sequencing, rearrangement analysis, and RNA analysis for the following 34 genes:   APC, ATM, AXIN2, BARD1, BMPR1A, BRCA1, BRCA2, BRIP1, CDH1, CDK4, CDKN2A, CHEK2, DICER1, HOXB13, EPCAM, GREM1, MLH1, MSH2, MSH3, MSH6, MUTYH, NF1, NTHL1, PALB2, PMS2, POLD1, POLE, PTEN, RAD51C, RAD51D, SMAD4, SMARCA4, STK11, and TP53.   Based on Derrick Merritt personal history of >10 tubular adenomas, he meets medical criteria for genetic testing. Despite that he meets criteria, he may still have an out of pocket cost. We discussed that if his out of pocket cost for testing is over $100, the  laboratory will call and confirm whether he wants to proceed with testing.  If the out of pocket cost of testing is less than $100 he will be billed by the genetic testing laboratory.   PLAN: After considering the risks, benefits, and limitations, Derrick Merritt provided informed consent to pursue genetic testing and the blood sample was sent to [facility] for analysis of the CancerNext Panel. Results should be available within approximately 2-3 weeks' time, at which point they will be disclosed by telephone to Derrick Merritt, as will any additional recommendations warranted by these results. Derrick Merritt will receive a summary of his genetic counseling visit and a copy of his results once available. This information will also be available in Epic.   Derrick Merritt questions were answered to his satisfaction today. Our contact information was provided should additional questions or concerns arise. Thank you for the referral and allowing [place] to share in  the care of your patient.   Lalla Brothers, MS, [facility] Genetic Counselor [email or web address] (P) [phone]  The patient was seen for a total of 30 minutes in face-to-face genetic counseling. The patient was seen alone.  Drs. Pamelia Hoit and/or Mosetta Putt were available to discuss this case as needed.   _______________________________________________________________________ For Office Staff:  Number of people involved in session: 1 Was an Intern/ student involved with case: no

## 2023-03-18 ENCOUNTER — Encounter: Payer: Self-pay | Admitting: Genetic Counselor

## 2023-03-18 ENCOUNTER — Ambulatory Visit: Payer: Self-pay | Admitting: Genetic Counselor

## 2023-03-18 ENCOUNTER — Telehealth: Payer: Self-pay | Admitting: Genetic Counselor

## 2023-03-18 DIAGNOSIS — Z1379 Encounter for other screening for genetic and chromosomal anomalies: Secondary | ICD-10-CM

## 2023-03-18 NOTE — Telephone Encounter (Signed)
I contacted Mr. Serino to discuss his genetic testing results. No pathogenic variants were identified in the 34 genes analyzed. Detailed clinic note to follow.  The test report has been scanned into EPIC and is located under the Molecular Pathology section of the Results Review tab.  A portion of the result report is included below for reference.   Lalla Brothers, MS, [facility] Genetic Counselor [email or web address] (P) [phone]

## 2023-03-18 NOTE — Progress Notes (Signed)
HPI:   [name] was previously seen in the [facility] due to a personal history of colon polyps and concerns regarding a hereditary predisposition to cancer. Please refer to our prior cancer genetics clinic note for more information regarding our discussion, assessment and recommendations, at the time. [name] recent genetic test results were disclosed to him, as were recommendations warranted by these results. These results and recommendations are discussed in more detail below.  CANCER HISTORY:  Oncology History   No history exists.    FAMILY HISTORY:  We obtained a detailed, 4-generation family history.  Significant diagnoses are listed below:      Family History  Problem Relation Age of Onset   Diabetes Mother     Hypertension Mother     Heart disease Mother     Diabetes Father     Hypertension Father     Cancer Maternal Aunt          unknown type   Cancer Maternal Uncle          unknown type   Lung cancer Cousin 48        maternal first cousin   Colon cancer Neg Hx     Stomach cancer Neg Hx     Pancreatic cancer Neg Hx     Esophageal cancer Neg Hx     Colon polyps Neg Hx           [name] reports one maternal aunt and one maternal uncle with a history of cancer (unknown type), they are deceased. He reports no known family history of colon polyps but has limited information about his family medical history. [name] is unaware of previous family history of genetic testing for hereditary cancer risks. There is no reported Ashkenazi Jewish ancestry.   GENETIC TEST RESULTS:  The [facility] CancerNext Panel found no pathogenic mutations.   The CancerNext gene panel offered by [employer] includes sequencing, rearrangement analysis, and RNA analysis for the following 34 genes:   APC, ATM, AXIN2, BARD1, BMPR1A, BRCA1, BRCA2, BRIP1, CDH1, CDK4, CDKN2A, CHEK2, DICER1, HOXB13, EPCAM, GREM1, MLH1, MSH2, MSH3, MSH6, MUTYH, NF1, NTHL1, PALB2, PMS2,  POLD1, POLE, PTEN, RAD51C, RAD51D, SMAD4, SMARCA4, STK11, and TP53.   The test report has been scanned into EPIC and is located under the Molecular Pathology section of the Results Review tab.  A portion of the result report is included below for reference. Genetic testing reported out on 03/10/2023.       Even though a pathogenic variant was not identified, possible explanations for his colon polyps may include: There may be no hereditary risk for colon polyps/cancer in the family. His colon polyps may be due to other genetic or environmental factors. There may be a gene mutation in one of these genes that current testing methods cannot detect, but that chance is small. There could be another gene that has not yet been discovered, or that we have not yet tested, that is responsible for his colon polyps.   Therefore, it is important to remain in touch with cancer genetics in the future so that we can continue to offer [name] the most up to date genetic testing.   ADDITIONAL GENETIC TESTING:  We discussed with [name] that his genetic testing was fairly extensive.  If there are genes identified to increase cancer risk that can be analyzed in the future, we would be happy to discuss and coordinate this testing at that time.  CANCER SCREENING RECOMMENDATIONS:  [name] test result is considered negative (normal).  This means that we have not identified a hereditary cause for his personal history of colon polyps at this time.   An individual's cancer risk and medical management are not determined by genetic test results alone. Overall cancer risk assessment incorporates additional factors, including personal medical history, family history, and any available genetic information that may result in a personalized plan for cancer prevention and surveillance. Therefore, it is recommended he continue to follow the cancer management and screening guidelines provided by his healthcare  providers.  RECOMMENDATIONS FOR FAMILY MEMBERS:   Since he did not inherit a mutation in a cancer predisposition gene included on this panel, his son could not have inherited a mutation from him in one of these genes.  FOLLOW-UP:  Cancer genetics is a rapidly advancing field and it is possible that new genetic tests will be appropriate for him and/or his family members in the future. We encouraged him to remain in contact with cancer genetics on an annual basis so we can update his personal and family histories and let him know of advances in cancer genetics that may benefit this family.   Our contact number was provided. [name] questions were answered to his satisfaction, and he knows he is welcome to call us at anytime with additional questions or concerns.   Derrick Brothers, MS, [facility] Genetic Counselor [email or web address] (P) [phone]

## 2023-03-25 ENCOUNTER — Encounter: Payer: Self-pay | Admitting: Genetic Counselor

## 2024-09-24 ENCOUNTER — Emergency Department

## 2024-09-24 ENCOUNTER — Encounter: Payer: Self-pay | Admitting: Emergency Medicine

## 2024-09-24 ENCOUNTER — Other Ambulatory Visit: Payer: Self-pay

## 2024-09-24 ENCOUNTER — Emergency Department
Admission: EM | Admit: 2024-09-24 | Discharge: 2024-09-24 | Disposition: A | Discharge status: Home / Self Care | Attending: Emergency Medicine | Admitting: Emergency Medicine

## 2024-09-24 DIAGNOSIS — Z87891 Personal history of nicotine dependence: Secondary | ICD-10-CM | POA: Insufficient documentation

## 2024-09-24 DIAGNOSIS — R0789 Other chest pain: Secondary | ICD-10-CM | POA: Insufficient documentation

## 2024-09-24 DIAGNOSIS — I10 Essential (primary) hypertension: Secondary | ICD-10-CM | POA: Insufficient documentation

## 2024-09-24 DIAGNOSIS — R079 Chest pain, unspecified: Secondary | ICD-10-CM

## 2024-09-24 LAB — CBC
HCT: 40.6 % (ref 39.0–52.0)
Hemoglobin: 13.6 g/dL (ref 13.0–17.0)
MCH: 29.3 pg (ref 26.0–34.0)
MCHC: 33.5 g/dL (ref 30.0–36.0)
MCV: 87.5 fL (ref 80.0–100.0)
Platelets: 174 K/uL (ref 150–400)
RBC: 4.64 MIL/uL (ref 4.22–5.81)
RDW: 13.1 % (ref 11.5–15.5)
WBC: 5.9 K/uL (ref 4.0–10.5)
nRBC: 0 % (ref 0.0–0.2)

## 2024-09-24 LAB — BASIC METABOLIC PANEL WITH GFR
Anion gap: 11 (ref 5–15)
BUN: 12 mg/dL (ref 6–20)
CO2: 25 mmol/L (ref 22–32)
Calcium: 9.2 mg/dL (ref 8.9–10.3)
Chloride: 104 mmol/L (ref 98–111)
Creatinine, Ser: 0.77 mg/dL (ref 0.61–1.24)
GFR, Estimated: >60 mL/min (ref >60)
Glucose, Bld: 107 mg/dL — ABNORMAL HIGH (ref 70–99)
Potassium: 4 mmol/L (ref 3.5–5.1)
Sodium: 140 mmol/L (ref 135–145)

## 2024-09-24 LAB — TROPONIN T, HIGH SENSITIVITY: Troponin T High Sensitivity: 16 ng/L (ref 0–19)

## 2024-09-24 MED ORDER — IOHEXOL 350 MG/ML SOLN
75.0000 mL | Freq: Once | INTRAVENOUS | Status: AC | PRN
  Administered 2024-09-24: 75 mL via INTRAVENOUS

## 2024-09-24 NOTE — ED Triage Notes (Signed)
 Pt to ED via POV c/o right sided chest pain that happened on Saturday. Pt states that the pain felt similar to when he had a blood clot in his lungs. 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 Pt endorses shortness of breath but states that his has been going on for a long time. Pt states he is not actively having chest pain he just does not feel well. Pt is A & O in triage, color is WNL and skin is warm and dry.

## 2024-09-24 NOTE — ED Provider Notes (Signed)
 [facility] Provider Note    Event Date/Time   First MD Initiated Contact with Patient 09/24/24 [phone]     (approximate)   History   Chest Pain   HPI  Derrick Merritt is a 58 y.o. male with history of hypertension, smoking history, PE who presents with complaints of chest discomfort.  He reports over the last 2 days he has had right sided chest discomfort which feels similar to when he had a PE many years ago.  He has rheumatoid arthritis but denies cough fevers or chills     Physical Exam   Triage Vital Signs: ED Triage Vitals [09/24/24 0809]  Encounter Vitals Group     BP (!) 145/63     Girls Systolic BP Percentile      Girls Diastolic BP Percentile      Boys Systolic BP Percentile      Boys Diastolic BP Percentile      Pulse Rate 70     Resp 16     Temp (!) 97.5 F (36.4 C)     Temp src      SpO2 99 %     Weight 114.3 kg (252 lb)     Height 1.829 m (6')     Head Circumference      Peak Flow      Pain Score 0     Pain Loc      Pain Education      Exclude from Growth Chart     Most recent vital signs: Vitals:   09/24/24 0809  BP: (!) 145/63  Pulse: 70  Resp: 16  Temp: (!) 97.5 F (36.4 C)  SpO2: 99%     General: Awake, no distress.  CV:  Good peripheral perfusion.  Normal rate Resp:  Normal effort.  Clear to auscultation bilaterally Abd:  No distention.   Other:  No rash   ED Results / Procedures / Treatments   Labs (all labs ordered are listed, but only abnormal results are displayed) Labs Reviewed  BASIC METABOLIC PANEL WITH GFR - Abnormal; Notable for the following components:      Result Value   Glucose, Bld 107 (*)    All other components within normal limits  CBC  TROPONIN T, HIGH SENSITIVITY  TROPONIN T, HIGH SENSITIVITY     EKG  ED ECG REPORT I, Lamar Price, the attending physician, personally viewed and interpreted this ECG.  Date: 09/24/2024  Rhythm: normal sinus rhythm QRS Axis:  normal Intervals: normal ST/T Wave abnormalities: normal Narrative Interpretation: no evidence of acute ischemia    RADIOLOGY Chest x-ray view interpret by me, no acute abnormality    PROCEDURES:  Critical Care performed:   Procedures   MEDICATIONS ORDERED IN ED: Medications  iohexol (OMNIPAQUE) 350 MG/ML injection 75 mL (75 mLs Intravenous Contrast Given 09/24/24 0859)     IMPRESSION / MDM / ASSESSMENT AND PLAN / ED COURSE  I reviewed the triage vital signs and the nursing notes. Patient's presentation is most consistent with acute presentation with potential threat to life or bodily function.  Patient presents with chest discomfort as detailed above, differential includes PE, angina, less likely ACS, pneumonia, musculoskeletal chest pain  High sensitive troponin and EKG are reassuring, not consistent with ACS.  Chest x-ray without evidence of pneumonia or pneumothorax.  Lab work otherwise reassuring, will send for CT angiography  CT scan negative for PE, discussed pulmonary nodules with the patient, he will discuss with his oncologist  on the 15th of this month  No indication for admission at this time, cardiology referral made, appropriate for discharge, return precautions discussed, he agrees with this plan.      FINAL CLINICAL IMPRESSION(S) / ED DIAGNOSES   Final diagnoses:  Nonspecific chest pain     Rx / DC Orders   ED Discharge Orders          Ordered    Ambulatory referral to Cardiology       Comments: If you have not heard from the Cardiology office within the next 72 hours please call [phone].   09/24/24 1015             Note:  This document was prepared using Dragon voice recognition software and may include unintentional dictation errors.   Arlander Charleston, MD 09/24/24 1025

## 2024-09-25 NOTE — Progress Notes (Unsigned)
 Cardiology Office Note    Date:  09/26/2024   ID:  Derrick Merritt, DOB April 24, 1966, MRN [id number]  PCP:  Loring Tanda Mae, MD  Cardiologist:  New  Electrophysiologist:  None   Chief Complaint: ED follow-up  History of Present Illness:   AARIC DOLPH is a 58 y.o. male with history of oral cancer status post left hemimandibulectomy and mandibular reconstruction in 2023, remote PE, rheumatoid arthritis, aortic atherosclerosis, HTN, HLD, pulmonary nodules, and nephrolithiasis who presents for ED follow-up of chest pain.  He does not have any previously known cardiac history.  He was seen in the [facility] ED on 09/24/2024 with a 2-day history of right sided chest discomfort that felt similar to prior remote PE.  EKG showed sinus rhythm with baseline artifact and nonspecific ST-T changes.  High-sensitivity troponin negative.  Chest x-ray demonstrated emphysema without pneumonia or pulmonary edema.  CTA of the chest was negative for PE with airway thickening suggestive of bronchitis versus reactive airway disease.  Also incidentally noted were 2 solid pulmonary nodules measuring 4 mm in the left lower lobe superior segment and 2 mm in the right lower lobe and mild lower thoracic spondylosis.  He comes in today and is accompanied by his wife.  He reports continued intermittently randomly occurring episodes of right-sided chest discomfort that will typically last for for 5 minutes and spontaneously resolved.  He also reports over the past several years he has noted some exertional fatigue and shortness of breath.  No lower extremity swelling, abdominal distention, early satiety, or orthopnea.  He quit smoking at the time of his oral cancer diagnosis.  Currently without symptoms of angina or cardiac decompensation.   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   RF +, CCP + RA x 2016 MULTIPLE NODULE FINGERS AND ELBOWS   Swelling of joint, wrist, right 05/24/2018   SYNOVIAL THICKENING RIGHT DORSUM WRIST    Past Surgical History:  Procedure Laterality Date   EXTRACORPOREAL SHOCK WAVE LITHOTRIPSY Right 12/24/2021   Procedure: EXTRACORPOREAL SHOCK WAVE LITHOTRIPSY (ESWL);  Surgeon: Francisca Redell BROCKS, MD;  Location: [facility] ORS;  Service: Urology;  Laterality: Right;   LEG SURGERY     SHOULDER SURGERY      Current Medications: Current Meds  Medication Sig   Abatacept 125 MG/ML SOAJ Inject 125 mg into the skin once a week. ORENCIA   albuterol (VENTOLIN HFA) 108 (90 Base) MCG/ACT inhaler Inhale into the lungs.   amLODipine (NORVASC) 10 MG tablet Take 10 mg by mouth daily.   atorvastatin (LIPITOR) 10 MG tablet Take 10 mg by mouth daily.   bisacodyl (DULCOLAX) 5 MG EC tablet Take 5 mg by mouth daily as needed for moderate constipation.   Calcium Carb-Cholecalciferol (OYSTER SHELL CALCIUM W/D) 500-5 MG-MCG TABS Take 1 tablet by mouth.   calcium carbonate (OSCAL) 1500 (600 Ca) MG TABS tablet Take 1,500 mg by mouth daily with breakfast.   finasteride (PROSCAR) 5 MG tablet Take 5 mg by mouth daily.  lisinopril (ZESTRIL) 10 MG tablet Take by mouth.   metoprolol tartrate (LOPRESSOR) 100 MG tablet TAKE 1 TABLET 2 HR PRIOR TO CARDIAC PROCEDURE   predniSONE (DELTASONE) 1 MG tablet Take 4 tablets by mouth daily. Take daily with breakfast   terazosin (HYTRIN) 10 MG capsule Take 2 mg by mouth at bedtime.    Allergies:   Bee pollen and Pollen extract   Social History   Socioeconomic History   Marital status: Married     Spouse name: Not on file   Number of children: Not on file   Years of education: Not on file   Highest education level: Not on file  Occupational History   Not on file  Tobacco Use   Smoking status: Former    Current packs/day: 0.00    Types: Cigarettes    Quit date: 07/25/2022    Years since quitting: 2.1   Smokeless tobacco: Never  Vaping Use   Vaping status: Never Used  Substance and Sexual Activity   Alcohol use: Yes   Drug use: Not Currently   Sexual activity: Yes    Partners: Female  Other Topics Concern   Not on file  Social History Narrative   Not on file   Social Drivers of Health   Financial Resource Strain: Not on file  Food Insecurity: Not on file  Transportation Needs: Not on file  Physical Activity: Not on file  Stress: Not on file  Social Connections: Not on file     Family History:  The patient's family history includes Cancer in his maternal aunt and maternal uncle; Diabetes in his father and mother; Heart disease in his mother; Hypertension in his father and mother; Lung cancer (age of onset: 13) in his cousin. There is no history of Colon cancer, Stomach cancer, Pancreatic cancer, Esophageal cancer, or Colon polyps.  ROS:   12-point review of systems is negative unless otherwise noted in the HPI.   EKGs/Labs/Other Studies Reviewed:    Studies reviewed were summarized above. The additional studies were reviewed today: None available for review.  EKG:  EKG is ordered today.  The EKG ordered today demonstrates NSR, 68 bpm, no acute st/t changes  Recent Labs: 09/24/2024: BUN 12; Creatinine, Ser 0.77; Hemoglobin 13.6; Platelets 174; Potassium 4.0; Sodium 140  Recent Lipid Panel No results found for: CHOL, TRIG, HDL, CHOLHDL, VLDL, LDLCALC, LDLDIRECT  PHYSICAL EXAM:    VS:  BP 124/80 (BP Location: Right Arm, Patient Position: Sitting, Cuff Size: Large)   Pulse 68   Ht 6' 0.5 (1.842 m)   Wt 269 lb (122 kg)   SpO2 97%   BMI 35.98  kg/m   BMI: Body mass index is 35.98 kg/m.  Physical Exam Vitals reviewed.  Constitutional:      Appearance: He is well-developed.  HENT:     Head: Normocephalic and atraumatic.  Eyes:     General:        Right eye: No discharge.        Left eye: No discharge.  Cardiovascular:     Rate and Rhythm: Normal rate and regular rhythm.     Pulses:          Posterior tibial pulses are 2+ on the right side and 2+ on the left side.     Heart sounds: Normal heart sounds, S1 normal and S2 normal. Heart sounds not distant. No midsystolic click and no opening snap. No murmur heard.    No friction rub.  Pulmonary:  Effort: Pulmonary effort is normal. No respiratory distress.     Breath sounds: Normal breath sounds. No decreased breath sounds, wheezing, rhonchi or rales.  Musculoskeletal:     Cervical back: Normal range of motion.     Right lower leg: No edema.     Left lower leg: No edema.  Skin:    General: Skin is warm and dry.     Nails: There is no clubbing.  Neurological:     Mental Status: He is alert and oriented to person, place, and time.  Psychiatric:        Speech: Speech normal.        Behavior: Behavior normal.        Thought Content: Thought content normal.        Judgment: Judgment normal.     Wt Readings from Last 3 Encounters:  09/26/24 269 lb (122 kg)  09/24/24 252 lb (114.3 kg)  12/22/22 227 lb (103 kg)     ASSESSMENT & PLAN:   Precordial pain: Currently without symptoms of angina or cardiac decompensation.  ED evaluation reassuring including high-sensitivity troponin, CTA chest, and EKG.  EKG in the office today is without acute ischemic changes.  Schedule coronary CTA and echo.  HTN: Blood pressure is well-controlled in the office today.  He remains on lisinopril 10 mg and amlodipine 10 mg.  Aortic atherosclerosis/HLD: No recent lipid panel available for review.  Check lipid panel, direct LDL, LFT, and LP(a) for further risk stratification.  Anticipate  ASCVD risk calculation upon updating labs.  Currently on atorvastatin 10 mg daily.  Pulmonary nodules: 2 solid pulmonary nodules measuring 4 mm in the left lower lobe superior segment and 2 mm in the right lower lobe noted on CT imaging in the [facility] ED on 09/24/2024.  Advised patient to discuss further with his oncologist.    Disposition: F/u with me in 2 months.   Medication Adjustments/Labs and Tests Ordered: Current medicines are reviewed at length with the patient today.  Concerns regarding medicines are outlined above. Medication changes, Labs and Tests ordered today are summarized above and listed in the Patient Instructions accessible in Encounters.   Signed, Bernardino Bring, PA-C 09/26/2024 4:29 PM     [facility] -  [address] [place], [place] [address] [phone]

## 2024-09-26 ENCOUNTER — Encounter: Payer: Self-pay | Admitting: Physician Assistant

## 2024-09-26 ENCOUNTER — Ambulatory Visit: Attending: Physician Assistant | Admitting: Physician Assistant

## 2024-09-26 VITALS — BP 124/80 | HR 68 | Ht 72.5 in | Wt 269.0 lb

## 2024-09-26 DIAGNOSIS — R918 Other nonspecific abnormal finding of lung field: Secondary | ICD-10-CM

## 2024-09-26 DIAGNOSIS — E785 Hyperlipidemia, unspecified: Secondary | ICD-10-CM | POA: Diagnosis not present

## 2024-09-26 DIAGNOSIS — R072 Precordial pain: Secondary | ICD-10-CM | POA: Diagnosis not present

## 2024-09-26 DIAGNOSIS — I10 Essential (primary) hypertension: Secondary | ICD-10-CM

## 2024-09-26 DIAGNOSIS — I700 Atherosclerosis of aorta: Secondary | ICD-10-CM | POA: Diagnosis not present

## 2024-09-26 DIAGNOSIS — Z79899 Other long term (current) drug therapy: Secondary | ICD-10-CM

## 2024-09-26 MED ORDER — METOPROLOL TARTRATE 100 MG PO TABS: ORAL_TABLET | ORAL | 0 refills | Status: AC

## 2024-09-26 NOTE — Patient Instructions (Addendum)
 Medication Instructions:  Your physician recommends that you continue on your current medications as directed. Please refer to the Current Medication list given to you today.   *If you need a refill on your cardiac medications before your next appointment, please call your pharmacy*  Lab Work: Your provider would like for you to have following labs drawn today Lipids, Direct LDL, LFT, and LP(a).   If you have labs (blood work) drawn today and your tests are completely normal, you will receive your results only by: MyChart Message (if you have MyChart) OR A paper copy in the mail If you have any lab test that is abnormal or we need to change your treatment, we will call you to review the results.  Testing/Procedures:   Your cardiac CT will be scheduled at one of the below locations:   [facility] [address] [place], [place] [address] [phone] (Severe contrast allergies only)  OR   [facility] [address] [place], [place] [address] [phone]  OR   [facility] [address] [place], [place] [address] [phone]  OR   [facility] and Vascular Tower [address]  [place], [place] [address]  OR   [facility] [address] [place], [place] [phone]  If scheduled at [facility], please arrive at the [facility] and Children's Entrance (Entrance C2) of [facility] 30 minutes prior to test start time. You can use the FREE valet parking offered at entrance C (encouraged to control the heart rate for the test)  Proceed to the [facility] Radiology Department (first floor) to check-in and test prep.  All radiology patients and guests should use entrance C2 at [facility], accessed from [facility], even though the hospital's physical address listed is [address]  If scheduled at the Heart and Vascular Tower at [address], please enter  the parking lot using the [address] entrance and use the FREE valet service at the patient drop-off area. Enter the building and check-in with registration on the main floor.  If scheduled at [facility], please arrive to the Heart and Vascular Center 15 mins early for check-in and test prep.  There is spacious parking and easy access to the radiology department from the [facility] Heart and Vascular entrance. Please enter here and check-in with the desk attendant.   If scheduled at [facility], please arrive 30 minutes early for check-in and test prep.  Please follow these instructions carefully (unless otherwise directed):  An IV will be required for this test and Nitroglycerin will be given.  Hold all erectile dysfunction medications at least 3 days (72 hrs) prior to test. (Ie viagra, cialis, sildenafil, tadalafil, etc)   On the Night Before the Test: Be sure to Drink plenty of water. Do not consume any caffeinated/decaffeinated beverages or chocolate 12 hours prior to your test. Do not take any antihistamines 12 hours prior to your test.   On the Day of the Test: Drink plenty of water until 1 hour prior to the test. Do not eat any food 1 hour prior to test. You may take your regular medications prior to the test.  Take metoprolol (Lopressor) two hours prior to test. If you take Furosemide/Hydrochlorothiazide/Spironolactone/Chlorthalidone, please HOLD on the morning of the test. Patients who wear a continuous glucose monitor MUST remove the device prior to scanning.      After the Test:  Drink plenty of water. After receiving IV contrast, you may experience a mild flushed feeling. This is normal. On occasion, you may experience a mild rash up to 24 hours after the test. This is not dangerous. If this occurs, you can take Benadryl  25 mg, Zyrtec, Claritin, or Allegra and increase your fluid intake. (Patients taking Tikosyn should avoid Benadryl , and may  take Zyrtec, Claritin, or Allegra) If you experience trouble breathing, this can be serious. If it is severe call 911 IMMEDIATELY. If it is mild, please call our office.  We will call to schedule your test 2-4 weeks out understanding that some insurance companies will need an authorization prior to the service being performed.   For more information and frequently asked questions, please visit our website : [email or web address]  For non-scheduling related questions, please contact the cardiac imaging nurse navigator should you have any questions/concerns: Cardiac Imaging Nurse Navigators Direct Office Dial: [phone]   For scheduling needs, including cancellations and rescheduling, please call Brittany, [phone].  Your physician has requested that you have an echocardiogram. Echocardiography is a painless test that uses sound waves to create images of your heart. It provides your doctor with information about the size and shape of your heart and how well your heart's chambers and valves are working.   You may receive an ultrasound enhancing agent through an IV if needed to better visualize your heart during the echo. This procedure takes approximately one hour.  There are no restrictions for this procedure.  This will take place at [address] [facility]) [address], [place] [address]  Please note: We ask at that you not bring children with you during ultrasound (echo/ vascular) testing. Due to room size and safety concerns, children are not allowed in the ultrasound rooms during exams. Our front office staff cannot provide observation of children in our lobby area while testing is being conducted. An adult accompanying a patient to their appointment will only be allowed in the ultrasound room at the discretion of the ultrasound technician under special circumstances. We apologize for any inconvenience.   Follow-Up: At [facility], you and your  health needs are our priority.  As part of our continuing mission to provide you with exceptional heart care, our providers are all part of one team.  This team includes your primary Cardiologist (physician) and Advanced Practice Providers or APPs (Physician Assistants and Nurse Practitioners) who all work together to provide you with the care you need, when you need it.  Your next appointment:   2 month(s)  Provider:   You may see one of the following Advanced Practice Providers on your designated Care Team:   Bernardino Bring, PA-C

## 2024-09-27 ENCOUNTER — Other Ambulatory Visit: Payer: Self-pay | Admitting: Physician Assistant

## 2024-09-27 ENCOUNTER — Ambulatory Visit: Payer: Self-pay | Admitting: Physician Assistant

## 2024-09-28 LAB — LIPID PANEL
Chol/HDL Ratio: 3.1 ratio (ref 0.0–5.0)
Cholesterol, Total: 105 mg/dL (ref 100–199)
HDL: 34 mg/dL — ABNORMAL LOW (ref >39)
LDL Chol Calc (NIH): 46 mg/dL (ref 0–99)
Triglycerides: 147 mg/dL (ref 0–149)
VLDL Cholesterol Cal: 25 mg/dL (ref 5–40)

## 2024-09-28 LAB — HEPATIC FUNCTION PANEL
ALT: 29 IU/L (ref 0–44)
AST: 24 IU/L (ref 0–40)
Albumin: 4.2 g/dL (ref 3.8–4.9)
Alkaline Phosphatase: 92 IU/L (ref 47–123)
Bilirubin Total: 1.1 mg/dL (ref 0.0–1.2)
Bilirubin, Direct: 0.34 mg/dL (ref 0.00–0.40)
Total Protein: 6.7 g/dL (ref 6.0–8.5)

## 2024-09-28 LAB — LIPOPROTEIN A (LPA): Lipoprotein (a): <8.4 nmol/L (ref <75.0)

## 2024-09-28 LAB — LDL CHOLESTEROL, DIRECT: LDL Direct: 44 mg/dL (ref 0–99)

## 2024-10-09 ENCOUNTER — Other Ambulatory Visit: Payer: Self-pay | Admitting: Physician Assistant

## 2024-10-10 ENCOUNTER — Other Ambulatory Visit: Payer: Self-pay | Admitting: Physician Assistant

## 2024-10-11 ENCOUNTER — Other Ambulatory Visit: Payer: Self-pay | Admitting: Physician Assistant

## 2024-10-31 ENCOUNTER — Ambulatory Visit

## 2024-10-31 DIAGNOSIS — R072 Precordial pain: Secondary | ICD-10-CM | POA: Diagnosis not present

## 2024-10-31 LAB — ECHOCARDIOGRAM COMPLETE
AR max vel: 2.86 cm2
AV Area VTI: 2.58 cm2
AV Area mean vel: 2.52 cm2
AV Mean grad: 3 mmHg
AV Peak grad: 5.9 mmHg
Ao pk vel: 1.21 m/s
Area-P 1/2: 3.37 cm2
S' Lateral: 3.25 cm

## 2024-11-26 NOTE — Progress Notes (Unsigned)
 "  Cardiology Office Note    Date:  11/27/2024   ID:  Derrick Merritt, Check Apr 12, 1966, MRN [id number]  PCP:  Loring Tanda Mae, MD  Cardiologist:  None  Electrophysiologist:  None   Chief Complaint: Follow up  History of Present Illness:   Derrick Merritt is a 59 y.o. male with history of oral cancer status post left hemimandibulectomy and mandibular reconstruction in 2023, remote PE, rheumatoid arthritis, aortic atherosclerosis, HTN, HLD, pulmonary nodules, and nephrolithiasis who presents for follow-up of echo.  He was seen in the [facility] ED on 09/24/2024 with a 2-day history of right sided chest discomfort that felt similar to prior remote PE.  EKG showed sinus rhythm with baseline artifact and nonspecific ST-T changes.  High-sensitivity troponin negative.  Chest x-ray demonstrated emphysema without pneumonia or pulmonary edema.  CTA of the chest was negative for PE with airway thickening suggestive of bronchitis versus reactive airway disease.  Also incidentally noted were 2 solid pulmonary nodules measuring 4 mm in the left lower lobe superior segment and 2 mm in the right lower lobe and mild lower thoracic spondylosis.  He established care in our office on 09/26/2024 and reported continued intermittent random recurring episodes of right sided chest discomfort that would last for about 5 minutes and spontaneously resolved.  He also reported a several year history of exertional fatigue and shortness of breath.  Coronary CTA was recommended and remains pending.  Echo in 10/2024 showed an EF of 55 to 60%, no regional wall motion abnormalities, normal LV diastolic function parameters, normal RV systolic function and ventricular cavity size, no significant valvular abnormality, and a normal CVP.  He comes in doing well from a cardiac perspective and is without symptoms of angina or cardiac decompensation.  No further symptoms consistent with what brought him to the ER in 09/2024.  He does continue to note  some exertional shortness of breath and fatigue without frank angina.  No dizziness, near-syncope, or syncope.  No significant lower extremity swelling or progressive orthopnea.   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   RF +, CCP + RA x 2016 MULTIPLE NODULE FINGERS AND ELBOWS   Swelling of joint, wrist, right 05/24/2018   SYNOVIAL THICKENING RIGHT DORSUM WRIST    Past Surgical History:  Procedure Laterality Date   EXTRACORPOREAL SHOCK WAVE LITHOTRIPSY Right 12/24/2021   Procedure: EXTRACORPOREAL SHOCK WAVE LITHOTRIPSY (ESWL);  Surgeon: Francisca Redell BROCKS, MD;  Location: [facility] ORS;  Service: Urology;  Laterality: Right;   LEG SURGERY     SHOULDER SURGERY      Current Medications: Active Medications[1]  Allergies:   Bee pollen and Pollen extract   Social History   Socioeconomic History   Marital status: Married    Spouse name: Not on file   Number of children: Not on file   Years of education: Not on file   Highest education level: Not on file  Occupational History   Not on file  Tobacco Use   Smoking status:  Former    Current packs/day: 0.00    Average packs/day: 1.0 packs/day    Types: Cigarettes    Quit date: 07/25/2022    Years since quitting: 2.3   Smokeless tobacco: Never  Vaping Use   Vaping status: Never Used  Substance and Sexual Activity   Alcohol use: Yes   Drug use: Not Currently   Sexual activity: Yes    Partners: Female  Other Topics Concern   Not on file  Social History Narrative   Not on  file   Social Drivers of Health   Tobacco Use: Medium Risk (11/27/2024)   Patient History    Smoking Tobacco Use: Former    Smokeless Tobacco Use: Never    Passive Exposure: Not on Actuary Strain: Not on file  Food Insecurity: Not on file  Transportation Needs: Not on file  Physical Activity: Not on file  Stress: Not on file  Social Connections: Not on file  Depression (EYV7-0): Not on file  Alcohol Screen: Not on file  Housing: Unknown (12/21/2023)   Received from [facility]   Epic    Unable to Pay for Housing in the Last Year: Not on file    Number of Times Moved in the Last Year: Not on file    At any time in the past 12 months, were you homeless or living in a shelter (including now)?: No  Utilities: Not on file  Health Literacy: Not on file     Family History:  The patient's family history includes Cancer in his maternal aunt and maternal uncle; Diabetes in his father and mother; Heart disease in his mother; Hypertension in his father and mother; Lung cancer (age of onset: 42) in his cousin. There is no history of Colon cancer, Stomach cancer, Pancreatic cancer, Esophageal cancer, or Colon polyps.  ROS:   12-point review of systems is negative unless otherwise noted in the HPI.   EKGs/Labs/Other Studies Reviewed:    Studies reviewed were summarized above. The additional studies were reviewed today:  2D echo 10/31/2024: 1. Left ventricular ejection fraction, by estimation, is 55 to 60%. Left  ventricular ejection fraction by PLAX is 60 %. The left ventricle has  normal function. The left ventricle has no regional wall motion  abnormalities. Left ventricular diastolic  parameters were normal.   2. Right ventricular systolic function is normal. The right ventricular  size is normal. Tricuspid regurgitation signal is inadequate for assessing  PA pressure.   3. The mitral valve is normal in structure. No evidence of mitral valve   regurgitation. No evidence of mitral stenosis.   4. The aortic valve is normal in structure. Aortic valve regurgitation is  not visualized. No aortic stenosis is present.   5. The inferior vena cava is normal in size with greater than 50%  respiratory variability, suggesting right atrial pressure of 3 mmHg.    EKG:  EKG is not ordered today.    Recent Labs: 09/24/2024: BUN 12; Creatinine, Ser 0.77; Hemoglobin 13.6; Platelets 174; Potassium 4.0; Sodium 140 09/26/2024: ALT 29  Recent Lipid Panel    Component Value Date/Time   CHOL 105 09/26/2024 1545   TRIG 147 09/26/2024 1545   HDL 34 (L) 09/26/2024 1545   CHOLHDL 3.1 09/26/2024 1545   LDLCALC 46 09/26/2024 1545   LDLDIRECT 44 09/26/2024 1545    PHYSICAL EXAM:    VS:  BP (!) 140/80 (BP Location: Left Arm, Patient Position: Sitting, Cuff Size: Normal)  Pulse 65   Ht 6' 1 (1.854 m)   Wt 267 lb 12.8 oz (121.5 kg)   SpO2 98%   BMI 35.33 kg/m   BMI: Body mass index is 35.33 kg/m.  Physical Exam Vitals reviewed.  Constitutional:      Appearance: He is well-developed.  HENT:     Head: Normocephalic and atraumatic.  Eyes:     General:        Right eye: No discharge.        Left eye: No discharge.  Cardiovascular:     Rate and Rhythm: Normal rate and regular rhythm.     Heart sounds: Normal heart sounds, S1 normal and S2 normal. Heart sounds not distant. No midsystolic click and no opening snap. No murmur heard.    No friction rub.  Pulmonary:     Effort: Pulmonary effort is normal. No respiratory distress.     Breath sounds: Normal breath sounds. No decreased breath sounds, wheezing, rhonchi or rales.  Musculoskeletal:     Cervical back: Normal range of motion.     Right lower leg: No edema.     Left lower leg: No edema.  Skin:    General: Skin is warm and dry.     Nails: There is no clubbing.  Neurological:     Mental Status: He is alert and oriented to person, place, and time.  Psychiatric:        Speech:  Speech normal.        Behavior: Behavior normal.        Thought Content: Thought content normal.        Judgment: Judgment normal.     Wt Readings from Last 3 Encounters:  11/27/24 267 lb 12.8 oz (121.5 kg)  09/26/24 269 lb (122 kg)  09/24/24 252 lb (114.3 kg)     ASSESSMENT & PLAN:   Precordial pain: Currently without symptoms of angina or cardiac decompensation.  Echo without any significant structural abnormality with preserved LV systolic function and normal wall motion.  Scheduled for coronary CTA on 12/14/2023, with further recommendations pending results.  Aggressive risk factor modification.  HTN: Blood pressure is mildly elevated in the office today, though previously well-controlled.  Remains on lisinopril 10 mg and amlodipine 10 mg.  Aortic atherosclerosis/HLD: LDL 44 in 09/2024.  Remains on atorvastatin 10 mg.  Pulmonary nodules: 2 solid pulmonary nodules measuring 4 mm in the left lower lobe superior segment and 2 mm in the right lower lobe noted on CT imaging in the ED in 09/2024.  Previously advised to discuss this further with his oncologist.     Disposition: F/u with me in 6 months, sooner if needed.   Medication Adjustments/Labs and Tests Ordered: Current medicines are reviewed at length with the patient today.  Concerns regarding medicines are outlined above. Medication changes, Labs and Tests ordered today are summarized above and listed in the Patient Instructions accessible in Encounters.   Signed, Bernardino Bring, PA-C 11/27/2024 4:32 PM     [facility] [place] [address] [place], [place] [address] [phone]     [1]  Current Meds  Medication Sig   Abatacept 125 MG/ML SOAJ Inject 125 mg into the skin once a week. ORENCIA   albuterol (VENTOLIN HFA) 108 (90 Base) MCG/ACT inhaler Inhale into the lungs.   amLODipine (NORVASC) 10 MG tablet Take 10 mg by mouth daily.   atorvastatin (LIPITOR) 10 MG tablet Take 10 mg by mouth  daily.  bisacodyl (DULCOLAX) 5 MG EC tablet Take 5 mg by mouth daily as needed for moderate constipation.   Calcium Carb-Cholecalciferol (OYSTER SHELL CALCIUM W/D) 500-5 MG-MCG TABS Take 1 tablet by mouth.   calcium carbonate (OSCAL) 1500 (600 Ca) MG TABS tablet Take 1,500 mg by mouth daily with breakfast.   finasteride (PROSCAR) 5 MG tablet Take 5 mg by mouth daily.   lisinopril (ZESTRIL) 10 MG tablet Take by mouth.   metoprolol  tartrate (LOPRESSOR ) 100 MG tablet TAKE 1 TABLET 2 HR PRIOR TO CARDIAC PROCEDURE   predniSONE (DELTASONE) 1 MG tablet Take 4 tablets by mouth daily. Take daily with breakfast   terazosin (HYTRIN) 10 MG capsule Take 2 mg by mouth at bedtime.   "

## 2024-11-27 ENCOUNTER — Encounter: Payer: Self-pay | Admitting: Physician Assistant

## 2024-11-27 ENCOUNTER — Ambulatory Visit: Admitting: Physician Assistant

## 2024-11-27 VITALS — BP 140/80 | HR 65 | Ht 73.0 in | Wt 267.8 lb

## 2024-11-27 DIAGNOSIS — R072 Precordial pain: Secondary | ICD-10-CM

## 2024-11-27 DIAGNOSIS — I10 Essential (primary) hypertension: Secondary | ICD-10-CM

## 2024-11-27 DIAGNOSIS — R918 Other nonspecific abnormal finding of lung field: Secondary | ICD-10-CM

## 2024-11-27 DIAGNOSIS — E785 Hyperlipidemia, unspecified: Secondary | ICD-10-CM

## 2024-11-27 DIAGNOSIS — I700 Atherosclerosis of aorta: Secondary | ICD-10-CM | POA: Diagnosis not present

## 2024-12-13 ENCOUNTER — Ambulatory Visit
